# Patient Record
Sex: Female | Born: 1955
Health system: Southern US, Community
[De-identification: ages and names within clinical notes are randomized; demographics above are authoritative.]

## PROBLEM LIST (undated history)

## (undated) DIAGNOSIS — F320 Major depressive disorder, single episode, mild: Secondary | ICD-10-CM

## (undated) DIAGNOSIS — E782 Mixed hyperlipidemia: Secondary | ICD-10-CM

## (undated) DIAGNOSIS — K219 Gastro-esophageal reflux disease without esophagitis: Secondary | ICD-10-CM

## (undated) DIAGNOSIS — K8309 Other cholangitis: Secondary | ICD-10-CM

## (undated) DIAGNOSIS — R5383 Other fatigue: Secondary | ICD-10-CM

## (undated) DIAGNOSIS — M0689 Other specified rheumatoid arthritis, multiple sites: Secondary | ICD-10-CM

## (undated) DIAGNOSIS — L710 Perioral dermatitis: Secondary | ICD-10-CM

## (undated) DIAGNOSIS — E1169 Type 2 diabetes mellitus with other specified complication: Secondary | ICD-10-CM

## (undated) HISTORY — DX: Other fatigue: R53.83

## (undated) HISTORY — DX: Type 2 diabetes mellitus with other specified complication: E11.69

## (undated) HISTORY — DX: Gastro-esophageal reflux disease without esophagitis: K21.9

## (undated) HISTORY — PX: CHOLECYSTECTOMY: SHX55

## (undated) HISTORY — DX: Other cholangitis: K83.09

## (undated) HISTORY — DX: Major depressive disorder, single episode, mild: F32.0

## (undated) HISTORY — DX: Perioral dermatitis: L71.0

## (undated) HISTORY — PX: ERCP: SHX60

## (undated) HISTORY — DX: Other specified rheumatoid arthritis, multiple sites: M06.89

## (undated) HISTORY — DX: Mixed hyperlipidemia: E78.2

## (undated) NOTE — *Deleted (*Deleted)
Physician Discharge Summary  Patient ID: Alexandra [name] MRN: [id number] DOB/AGE: Nov 07, 1955 26 y.o.  Admit date: 06/27/2020 Discharge date: 06/29/2020  Admission Diagnoses: Cervical Myelopathy with herniated cervical disc at C 34 with cord compression and cord signal change. Severe spinal stenosis with AP diameter of spinal canal of 3.6 mm    Discharge Diagnoses: Cervical Myelopathy with herniated cervical disc at C 34 with cord compression and cord signal change. Severe spinal stenosis with AP diameter of spinal canal of 3.6 mm s/p Cervical three-four Anterior cervical decompression/discectomy/fusion (N/A) with 6 mm small lordotic  PEEK cage with autograft and anterior cervical plate     Active Problems:   Cervical myelopathy [facility])   Discharged Condition: good  Hospital Course: Alexandra [name] was admitted for surgery with dx cervical myelopathy with HNP and cord signal change. Following uncomplicated ACDF C3-4, she recovered well and transferred to [facility] for nursing care and therapies. She is progressing nicely.  Consults: None  Significant Diagnostic Studies: radiology: X-Ray: intra-op  Treatments: surgery: Cervical three-four Anterior cervical decompression/discectomy/fusion (N/A) with 6 mm small lordotic  PEEK cage with autograft and anterior cervical plate     Discharge Exam: Blood pressure (!) 144/74, pulse 98, temperature 98.1 F (36.7 C), temperature source Oral, resp. rate 18, height 5\' 5"  (1.651 m), weight 125.2 kg, SpO2 95 %. Alert, conversant sitting in chair. Denies pain at present. Good strength bilat deltoids, biceps and triceps, with improved but not full hand intrinsics. Ambulating with rolling walker, improved. Incision without erythema, swelling or drainage beneath honeycomb and Dermabond. Soft collar in use.   Disposition: Discharge to home with HHPT per DrStern. Office f/u in 3 weeks. Pt verbalizes understanding of d/c instructions. Norco 5/325 and Robaxin 500mg  will be  eRx'ed for prn home use.      Allergies as of 06/29/2020   No Known Allergies   Med Rec must be completed prior to using this [facility]***       Follow-up Information    [facility] Billing Dept. Call.   Why: For assistance with a payment plan on your hospital stay.  Contact information: [phone]       [facility] Dept. of Social Services. Call.   Why: To request info on financial assistance.  Contact information: [phone]       [facility], [facility] Follow up.   Specialty: Home Health Services Why: Home Health PT arranged. They will contact you about 48 hours after discharge to arrange the first visit.  Contact information: [address] [place] [address] [phone]               Signed: Georgiann Cocker 06/29/2020, 11:07 AM

---

## 2017-09-23 DIAGNOSIS — Z1231 Encounter for screening mammogram for malignant neoplasm of breast: Secondary | ICD-10-CM | POA: Diagnosis not present

## 2017-09-23 LAB — HM MAMMOGRAPHY

## 2017-09-27 DIAGNOSIS — I10 Essential (primary) hypertension: Secondary | ICD-10-CM | POA: Diagnosis not present

## 2017-09-27 DIAGNOSIS — Z01818 Encounter for other preprocedural examination: Secondary | ICD-10-CM | POA: Diagnosis not present

## 2017-09-27 DIAGNOSIS — E1169 Type 2 diabetes mellitus with other specified complication: Secondary | ICD-10-CM | POA: Diagnosis not present

## 2017-09-27 DIAGNOSIS — R740 Nonspecific elevation of levels of transaminase and lactic acid dehydrogenase [LDH]: Secondary | ICD-10-CM | POA: Diagnosis not present

## 2017-09-27 DIAGNOSIS — K8065 Calculus of gallbladder and bile duct with chronic cholecystitis with obstruction: Secondary | ICD-10-CM | POA: Diagnosis not present

## 2017-09-27 DIAGNOSIS — K760 Fatty (change of) liver, not elsewhere classified: Secondary | ICD-10-CM | POA: Diagnosis not present

## 2017-09-27 DIAGNOSIS — F419 Anxiety disorder, unspecified: Secondary | ICD-10-CM | POA: Diagnosis not present

## 2017-09-27 DIAGNOSIS — E119 Type 2 diabetes mellitus without complications: Secondary | ICD-10-CM | POA: Diagnosis not present

## 2017-09-27 DIAGNOSIS — K8042 Calculus of bile duct with acute cholecystitis without obstruction: Secondary | ICD-10-CM | POA: Diagnosis not present

## 2017-09-27 DIAGNOSIS — K851 Biliary acute pancreatitis without necrosis or infection: Secondary | ICD-10-CM | POA: Diagnosis not present

## 2017-09-27 DIAGNOSIS — K8050 Calculus of bile duct without cholangitis or cholecystitis without obstruction: Secondary | ICD-10-CM | POA: Diagnosis not present

## 2017-09-27 DIAGNOSIS — R748 Abnormal levels of other serum enzymes: Secondary | ICD-10-CM | POA: Diagnosis not present

## 2017-09-27 DIAGNOSIS — K8010 Calculus of gallbladder with chronic cholecystitis without obstruction: Secondary | ICD-10-CM | POA: Diagnosis not present

## 2017-09-27 DIAGNOSIS — R079 Chest pain, unspecified: Secondary | ICD-10-CM | POA: Diagnosis not present

## 2017-09-27 DIAGNOSIS — K740 Hepatic fibrosis: Secondary | ICD-10-CM | POA: Diagnosis not present

## 2017-09-27 DIAGNOSIS — K8020 Calculus of gallbladder without cholecystitis without obstruction: Secondary | ICD-10-CM | POA: Diagnosis not present

## 2017-09-27 DIAGNOSIS — R945 Abnormal results of liver function studies: Secondary | ICD-10-CM | POA: Diagnosis not present

## 2017-09-27 DIAGNOSIS — R1013 Epigastric pain: Secondary | ICD-10-CM | POA: Diagnosis not present

## 2017-09-27 DIAGNOSIS — K831 Obstruction of bile duct: Secondary | ICD-10-CM | POA: Diagnosis not present

## 2017-09-27 DIAGNOSIS — E1101 Type 2 diabetes mellitus with hyperosmolarity with coma: Secondary | ICD-10-CM | POA: Diagnosis not present

## 2017-09-28 DIAGNOSIS — Z01818 Encounter for other preprocedural examination: Secondary | ICD-10-CM

## 2017-09-30 DIAGNOSIS — K740 Hepatic fibrosis: Secondary | ICD-10-CM | POA: Diagnosis not present

## 2017-09-30 DIAGNOSIS — K8010 Calculus of gallbladder with chronic cholecystitis without obstruction: Secondary | ICD-10-CM | POA: Diagnosis not present

## 2017-10-14 DIAGNOSIS — K8309 Other cholangitis: Secondary | ICD-10-CM | POA: Diagnosis not present

## 2017-10-14 DIAGNOSIS — R799 Abnormal finding of blood chemistry, unspecified: Secondary | ICD-10-CM | POA: Diagnosis not present

## 2017-10-14 DIAGNOSIS — K8010 Calculus of gallbladder with chronic cholecystitis without obstruction: Secondary | ICD-10-CM | POA: Insufficient documentation

## 2017-10-14 DIAGNOSIS — Z09 Encounter for follow-up examination after completed treatment for conditions other than malignant neoplasm: Secondary | ICD-10-CM | POA: Insufficient documentation

## 2017-11-04 DIAGNOSIS — J09X2 Influenza due to identified novel influenza A virus with other respiratory manifestations: Secondary | ICD-10-CM | POA: Diagnosis not present

## 2017-11-04 DIAGNOSIS — J060 Acute laryngopharyngitis: Secondary | ICD-10-CM | POA: Diagnosis not present

## 2017-11-14 DIAGNOSIS — Z760 Encounter for issue of repeat prescription: Secondary | ICD-10-CM | POA: Diagnosis not present

## 2018-01-20 DIAGNOSIS — L988 Other specified disorders of the skin and subcutaneous tissue: Secondary | ICD-10-CM | POA: Diagnosis not present

## 2018-01-26 DIAGNOSIS — M25511 Pain in right shoulder: Secondary | ICD-10-CM | POA: Diagnosis not present

## 2018-02-23 DIAGNOSIS — M25512 Pain in left shoulder: Secondary | ICD-10-CM | POA: Diagnosis not present

## 2018-02-23 DIAGNOSIS — M7542 Impingement syndrome of left shoulder: Secondary | ICD-10-CM | POA: Diagnosis not present

## 2018-04-22 DIAGNOSIS — N3000 Acute cystitis without hematuria: Secondary | ICD-10-CM | POA: Diagnosis not present

## 2018-04-22 DIAGNOSIS — E782 Mixed hyperlipidemia: Secondary | ICD-10-CM | POA: Diagnosis not present

## 2018-04-22 DIAGNOSIS — E1169 Type 2 diabetes mellitus with other specified complication: Secondary | ICD-10-CM | POA: Diagnosis not present

## 2018-04-22 DIAGNOSIS — R5383 Other fatigue: Secondary | ICD-10-CM | POA: Diagnosis not present

## 2018-05-01 DIAGNOSIS — R768 Other specified abnormal immunological findings in serum: Secondary | ICD-10-CM | POA: Diagnosis not present

## 2018-05-01 DIAGNOSIS — M130 Polyarthritis, unspecified: Secondary | ICD-10-CM | POA: Diagnosis not present

## 2018-05-01 DIAGNOSIS — M7989 Other specified soft tissue disorders: Secondary | ICD-10-CM | POA: Diagnosis not present

## 2018-05-19 DIAGNOSIS — M0579 Rheumatoid arthritis with rheumatoid factor of multiple sites without organ or systems involvement: Secondary | ICD-10-CM | POA: Diagnosis not present

## 2018-05-19 DIAGNOSIS — R768 Other specified abnormal immunological findings in serum: Secondary | ICD-10-CM | POA: Diagnosis not present

## 2018-05-19 DIAGNOSIS — M130 Polyarthritis, unspecified: Secondary | ICD-10-CM | POA: Diagnosis not present

## 2018-06-22 DIAGNOSIS — M0579 Rheumatoid arthritis with rheumatoid factor of multiple sites without organ or systems involvement: Secondary | ICD-10-CM | POA: Diagnosis not present

## 2018-08-20 DIAGNOSIS — M130 Polyarthritis, unspecified: Secondary | ICD-10-CM | POA: Diagnosis not present

## 2018-08-20 DIAGNOSIS — R768 Other specified abnormal immunological findings in serum: Secondary | ICD-10-CM | POA: Diagnosis not present

## 2018-08-20 DIAGNOSIS — M0579 Rheumatoid arthritis with rheumatoid factor of multiple sites without organ or systems involvement: Secondary | ICD-10-CM | POA: Diagnosis not present

## 2018-09-17 DIAGNOSIS — E782 Mixed hyperlipidemia: Secondary | ICD-10-CM | POA: Diagnosis not present

## 2018-09-17 DIAGNOSIS — E1169 Type 2 diabetes mellitus with other specified complication: Secondary | ICD-10-CM | POA: Diagnosis not present

## 2018-09-17 DIAGNOSIS — Z23 Encounter for immunization: Secondary | ICD-10-CM | POA: Diagnosis not present

## 2020-02-01 ENCOUNTER — Other Ambulatory Visit: Payer: Self-pay

## 2020-02-01 ENCOUNTER — Encounter: Payer: Self-pay | Admitting: Physician Assistant

## 2020-02-01 ENCOUNTER — Ambulatory Visit (INDEPENDENT_AMBULATORY_CARE_PROVIDER_SITE_OTHER): Payer: 59 | Admitting: Physician Assistant

## 2020-02-01 VITALS — BP 142/62 | HR 73 | Temp 97.9°F | Ht 64.5 in | Wt 271.0 lb

## 2020-02-01 DIAGNOSIS — N3001 Acute cystitis with hematuria: Secondary | ICD-10-CM | POA: Diagnosis not present

## 2020-02-01 DIAGNOSIS — E119 Type 2 diabetes mellitus without complications: Secondary | ICD-10-CM | POA: Diagnosis not present

## 2020-02-01 DIAGNOSIS — E782 Mixed hyperlipidemia: Secondary | ICD-10-CM | POA: Insufficient documentation

## 2020-02-01 DIAGNOSIS — I10 Essential (primary) hypertension: Secondary | ICD-10-CM | POA: Diagnosis not present

## 2020-02-01 LAB — POCT URINALYSIS DIPSTICK
Bilirubin, UA: NEGATIVE
Glucose, UA: POSITIVE — AB
Ketones, UA: NEGATIVE
Nitrite, UA: NEGATIVE
Protein, UA: POSITIVE — AB
Spec Grav, UA: >=1.03 — AB (ref 1.010–1.025)
Urobilinogen, UA: NEGATIVE E.U./dL — AB
pH, UA: 5.5 (ref 5.0–8.0)

## 2020-02-01 LAB — POCT UA - MICROALBUMIN: Microalbumin Ur, POC: 150 mg/L

## 2020-02-01 MED ORDER — METFORMIN HCL 1000 MG PO TABS: 1000.0000 mg | ORAL_TABLET | Freq: Two times a day (BID) | ORAL | 0 refills | Status: DC

## 2020-02-01 MED ORDER — ROSUVASTATIN CALCIUM 5 MG PO TABS: 5.0000 mg | ORAL_TABLET | Freq: Every day | ORAL | 0 refills | Status: DC

## 2020-02-01 MED ORDER — MONTELUKAST SODIUM 10 MG PO TABS: 10.0000 mg | ORAL_TABLET | Freq: Every day | ORAL | 3 refills | Status: DC

## 2020-02-01 MED ORDER — RAMIPRIL 5 MG PO CAPS: 5.0000 mg | ORAL_CAPSULE | Freq: Every day | ORAL | 0 refills | Status: DC

## 2020-02-01 MED ORDER — FLUCONAZOLE 150 MG PO TABS: ORAL_TABLET | ORAL | 0 refills | Status: DC

## 2020-02-01 MED ORDER — RYBELSUS 3 MG PO TABS: 3.0000 mg | ORAL_TABLET | Freq: Every day | ORAL | 2 refills | Status: DC

## 2020-02-01 MED ORDER — NITROFURANTOIN MONOHYD MACRO 100 MG PO CAPS: 100.0000 mg | ORAL_CAPSULE | Freq: Two times a day (BID) | ORAL | 0 refills | Status: DC

## 2020-02-01 NOTE — Assessment & Plan Note (Signed)
Urine culture pending rx for macrobid 

## 2020-02-01 NOTE — Assessment & Plan Note (Signed)
labwork pending Follow up in one month Restart glucophage 1000mg  bid Start rybelsus 3mg  qd

## 2020-02-01 NOTE — Assessment & Plan Note (Signed)
Restart ramipril 5mg  qd labwork pending Follow up one month

## 2020-02-01 NOTE — Progress Notes (Signed)
Established Patient Office Visit  Subjective:  Patient ID: Alexandra [name], female    DOB: 09-26-1955  Age: 64 y.o. MRN: [id number]  CC:  Chief Complaint  Patient presents with  . Diabetes    HPI Alexandra [name] presents for follow up diabetes Pt states she did not have insurance and stopped all of her medications over 3 months ago - she has no idea what her sugars have been running but has been very tired and fatigued Last a1c was out of control - has not been in office in over 9 months  Pt with history of hyperlipidemia - no meds in over 3 months - not really watching diet  Pt with history of RA - did see specialist yesterday and restarted her methotrexate  Pt complains of urinary  Frequency and urgency - has been going on for 'awhile'  Past Medical History:  Diagnosis Date  . Gastro-esophageal reflux disease without esophagitis   . Major depressive disorder, single episode, mild ([place])   . Mixed hyperlipidemia   . Other cholangitis   . Other fatigue   . Other specified rheumatoid arthritis, multiple sites ([place])   . Perioral dermatitis   . Type 2 diabetes mellitus with other specified complication [facility])     Past Surgical History:  Procedure Laterality Date  . CHOLECYSTECTOMY    . ERCP      History reviewed. No pertinent family history.  Social History   Socioeconomic History  . Marital status: Single    Spouse name: Not on file  . Number of children: Not on file  . Years of education: Not on file  . Highest education level: Not on file  Occupational History  . Not on file  Tobacco Use  . Smoking status: Never Smoker  . Smokeless tobacco: Never Used  Vaping Use  . Vaping Use: Never used  Substance and Sexual Activity  . Alcohol use: Never  . Drug use: Never  . Sexual activity: Not on file  Other Topics Concern  . Not on file  Social History Narrative  . Not on file   Social Determinants of Health   Financial Resource Strain:   . Difficulty of Paying Living  Expenses:   Food Insecurity:   . Worried About Charity fundraiser in the Last Year:   . Arboriculturist in the Last Year:   Transportation Needs:   . Film/video editor (Medical):   Marland Kitchen Lack of Transportation (Non-Medical):   Physical Activity:   . Days of Exercise per Week:   . Minutes of Exercise per Session:   Stress:   . Feeling of Stress :   Social Connections:   . Frequency of Communication with Friends and Family:   . Frequency of Social Gatherings with Friends and Family:   . Attends Religious Services:   . Active Member of Clubs or Organizations:   . Attends Archivist Meetings:   Marland Kitchen Marital Status:   Intimate Partner Violence:   . Fear of Current or Ex-Partner:   . Emotionally Abused:   Marland Kitchen Physically Abused:   . Sexually Abused:      Current Outpatient Medications:  .  fluconazole (DIFLUCAN) 150 MG tablet, 1 po qod for 3 doses, Disp: 3 tablet, Rfl: 0 .  folic acid (FOLVITE) 1 MG tablet, Take 1 mg by mouth daily., Disp: , Rfl:  .  LORazepam (ATIVAN) 1 MG tablet, Take by mouth., Disp: , Rfl:  .  metFORMIN (GLUCOPHAGE) 1000  MG tablet, Take 1 tablet (1,000 mg total) by mouth 2 (two) times daily with a meal., Disp: 180 tablet, Rfl: 0 .  methotrexate (RHEUMATREX) 2.5 MG tablet, (10 weekly), Disp: , Rfl:  .  montelukast (SINGULAIR) 10 MG tablet, Take 1 tablet (10 mg total) by mouth daily., Disp: 90 tablet, Rfl: 3 .  nitrofurantoin, macrocrystal-monohydrate, (MACROBID) 100 MG capsule, Take 1 capsule (100 mg total) by mouth 2 (two) times daily., Disp: 20 capsule, Rfl: 0 .  ramipril (ALTACE) 5 MG capsule, Take 1 capsule (5 mg total) by mouth daily., Disp: 90 capsule, Rfl: 0 .  rosuvastatin (CRESTOR) 5 MG tablet, Take 1 tablet (5 mg total) by mouth daily., Disp: 90 tablet, Rfl: 0 .  Semaglutide (RYBELSUS) 3 MG TABS, Take 3 mg by mouth daily., Disp: 30 tablet, Rfl: 2   No Known Allergies  ROS CONSTITUTIONAL: see HPI E/N/T: Negative for ear pain, nasal congestion  and sore throat.  CARDIOVASCULAR: Negative for chest pain, dizziness, palpitations and pedal edema.  RESPIRATORY: Negative for recent cough and dyspnea.  GASTROINTESTINAL: Negative for abdominal pain, acid reflux symptoms, constipation, diarrhea, nausea and vomiting.  MSK: Negative for arthralgias and myalgias.  GU  - see HPI INTEGUMENTARY: Negative for rash.  NEUROLOGICAL: Negative for dizziness and headaches.  PSYCHIATRIC: Negative for sleep disturbance and to question depression screen.  Negative for depression, negative for anhedonia.        Objective:    PHYSICAL EXAM:   VS: BP (!) 142/62 (BP Location: Left Arm, Patient Position: Sitting)   Pulse 73   Temp 97.9 F (36.6 C) (Temporal)   Ht 5' 4.5" (1.638 m)   Wt 271 lb (122.9 kg)   BMI 45.80 kg/m   GEN: Well nourished, well developed, in no acute distress  Cardiac: RRR; no murmurs, rubs, or gallops,no edema - no significant varicosities Respiratory:  normal respiratory rate and pattern with no distress - normal breath sounds with no rales, rhonchi, wheezes or rubs MS: no deformity or atrophy  Skin: warm and dry, no rash  Neuro:  Alert and Oriented x 3, Strength and sensation are intact - CN II-Xii grossly intact Psych: euthymic mood, appropriate affect and demeanor  BP (!) 142/62 (BP Location: Left Arm, Patient Position: Sitting)   Pulse 73   Temp 97.9 F (36.6 C) (Temporal)   Ht 5' 4.5" (1.638 m)   Wt 271 lb (122.9 kg)   BMI 45.80 kg/m  Wt Readings from Last 3 Encounters:  02/01/20 271 lb (122.9 kg)     Health Maintenance Due  Topic Date Due  . HEMOGLOBIN A1C  Never done  . Hepatitis C Screening  Never done  . PNEUMOCOCCAL POLYSACCHARIDE VACCINE AGE 21-64 HIGH RISK  Never done  . FOOT EXAM  Never done  . OPHTHALMOLOGY EXAM  Never done  . COVID-19 Vaccine (1) Never done  . HIV Screening  Never done  . TETANUS/TDAP  Never done  . PAP SMEAR-Modifier  Never done  . MAMMOGRAM  Never done  . COLONOSCOPY  Never  done    There are no preventive care reminders to display for this patient.  No results found for: TSH No results found for: WBC, HGB, HCT, MCV, PLT No results found for: NA, K, CHLORIDE, CO2, GLUCOSE, BUN, CREATININE, BILITOT, ALKPHOS, AST, ALT, PROT, ALBUMIN, CALCIUM, ANIONGAP, EGFR, GFR No results found for: CHOL No results found for: HDL No results found for: LDLCALC No results found for: TRIG No results found for: CHOLHDL No results  found for: HGBA1C    Assessment & Plan:   Problem List Items Addressed This Visit      Cardiovascular and Mediastinum   Essential hypertension    Restart ramipril 21m qd labwork pending Follow up one month      Relevant Medications   ramipril (ALTACE) 5 MG capsule   rosuvastatin (CRESTOR) 5 MG tablet   Other Relevant Orders   CBC with Differential/Platelet   Comprehensive metabolic panel   TSH     Endocrine   Diabetes mellitus type II, non insulin dependent [place]    labwork pending Follow up in one month Restart glucophage 10012mbid Start rybelsus 58m62md       Relevant Medications   Semaglutide (RYBELSUS) 3 MG TABS   metFORMIN (GLUCOPHAGE) 1000 MG tablet   ramipril (ALTACE) 5 MG capsule   rosuvastatin (CRESTOR) 5 MG tablet   Other Relevant Orders   Hemoglobin A1c   POCT urinalysis dipstick (Completed)   POCT UA - Microalbumin (Completed)     Genitourinary   Acute cystitis with hematuria    Urine culture pending rx for macrobid      Relevant Medications   nitrofurantoin, macrocrystal-monohydrate, (MACROBID) 100 MG capsule   Other Relevant Orders   Urine Culture     Other   Mixed hyperlipidemia   Relevant Medications   ramipril (ALTACE) 5 MG capsule   rosuvastatin (CRESTOR) 5 MG tablet   Other Relevant Orders   Lipid panel      Meds ordered this encounter  Medications  . Semaglutide (RYBELSUS) 3 MG TABS    Sig: Take 3 mg by mouth daily.    Dispense:  30 tablet    Refill:  2    Order Specific  Question:   Supervising Provider    Answer:  Rochel [id number] nitrofurantoin, macrocrystal-monohydrate, (MACROBID) 100 MG capsule    Sig: Take 1 capsule (100 mg total) by mouth 2 (two) times daily.    Dispense:  20 capsule    Refill:  0    Order Specific Question:   Supervising Provider    Answer:  Rochel [id number] fluconazole (DIFLUCAN) 150 MG tablet    Sig: 1 po qod for 3 doses    Dispense:  3 tablet    Refill:  0    Order Specific Question:   Supervising Provider    Answer:  Shelton Silvas metFORMIN (GLUCOPHAGE) 1000 MG tablet    Sig: Take 1 tablet (1,000 mg total) by mouth 2 (two) times daily with a meal.    Dispense:  180 tablet    Refill:  0    Order Specific Question:   Supervising Provider    Answer:  Shelton Silvas ramipril (ALTACE) 5 MG capsule    Sig: Take 1 capsule (5 mg total) by mouth daily.    Dispense:  90 capsule    Refill:  0    Order Specific Question:   Supervising Provider    Answer:  Rochel [id number] montelukast (SINGULAIR) 10 MG tablet    Sig: Take 1 tablet (10 mg total) by mouth daily.    Dispense:  90 tablet    Refill:  3    Order Specific Question:   Supervising Provider    Answer:  Rochel [id number] rosuvastatin (CRESTOR) 5 MG tablet    Sig: Take 1 tablet (5 mg total) by  mouth daily.    Dispense:  90 tablet    Refill:  0    Order Specific Question:   Supervising Provider    Answer:   Shelton Silvas    Follow-up: Return in about 4 weeks (around 02/29/2020) for follow up.    SARA R Candita Borenstein, PA-C

## 2020-02-02 LAB — CBC WITH DIFFERENTIAL/PLATELET
Basophils Absolute: 0.1 10*3/uL (ref 0.0–0.2)
Basos: 1 %
EOS (ABSOLUTE): 0.2 10*3/uL (ref 0.0–0.4)
Eos: 3 %
Hematocrit: 46.6 % (ref 34.0–46.6)
Hemoglobin: 15.3 g/dL (ref 11.1–15.9)
Immature Grans (Abs): 0 10*3/uL (ref 0.0–0.1)
Immature Granulocytes: 0 %
Lymphocytes Absolute: 1.8 10*3/uL (ref 0.7–3.1)
Lymphs: 26 %
MCH: 30.8 pg (ref 26.6–33.0)
MCHC: 32.8 g/dL (ref 31.5–35.7)
MCV: 94 fL (ref 79–97)
Monocytes Absolute: 0.6 10*3/uL (ref 0.1–0.9)
Monocytes: 9 %
Neutrophils Absolute: 4.2 10*3/uL (ref 1.4–7.0)
Neutrophils: 61 %
Platelets: 268 10*3/uL (ref 150–450)
RBC: 4.97 x10E6/uL (ref 3.77–5.28)
RDW: 12.5 % (ref 11.7–15.4)
WBC: 6.9 10*3/uL (ref 3.4–10.8)

## 2020-02-02 LAB — COMPREHENSIVE METABOLIC PANEL
ALT: 32 IU/L (ref 0–32)
AST: 32 IU/L (ref 0–40)
Albumin/Globulin Ratio: 1.4 (ref 1.2–2.2)
Albumin: 4.1 g/dL (ref 3.8–4.8)
Alkaline Phosphatase: 86 IU/L (ref 48–121)
BUN/Creatinine Ratio: 22 (ref 12–28)
BUN: 22 mg/dL (ref 8–27)
Bilirubin Total: 0.5 mg/dL (ref 0.0–1.2)
CO2: 23 mmol/L (ref 20–29)
Calcium: 9.9 mg/dL (ref 8.7–10.3)
Chloride: 97 mmol/L (ref 96–106)
Creatinine, Ser: 1 mg/dL (ref 0.57–1.00)
GFR calc Af Amer: 70 mL/min/{1.73_m2} (ref >59)
GFR calc non Af Amer: 61 mL/min/{1.73_m2} (ref >59)
Globulin, Total: 2.9 g/dL (ref 1.5–4.5)
Glucose: 364 mg/dL — ABNORMAL HIGH (ref 65–99)
Potassium: 4.6 mmol/L (ref 3.5–5.2)
Sodium: 133 mmol/L — ABNORMAL LOW (ref 134–144)
Total Protein: 7 g/dL (ref 6.0–8.5)

## 2020-02-02 LAB — LIPID PANEL
Chol/HDL Ratio: 8 ratio — ABNORMAL HIGH (ref 0.0–4.4)
Cholesterol, Total: 249 mg/dL — ABNORMAL HIGH (ref 100–199)
HDL: 31 mg/dL — ABNORMAL LOW (ref >39)
LDL Chol Calc (NIH): 152 mg/dL — ABNORMAL HIGH (ref 0–99)
Triglycerides: 351 mg/dL — ABNORMAL HIGH (ref 0–149)
VLDL Cholesterol Cal: 66 mg/dL — ABNORMAL HIGH (ref 5–40)

## 2020-02-02 LAB — URINE CULTURE

## 2020-02-02 LAB — HEMOGLOBIN A1C
Est. average glucose Bld gHb Est-mCnc: 298 mg/dL
Hgb A1c MFr Bld: 12 % — ABNORMAL HIGH (ref 4.8–5.6)

## 2020-02-02 LAB — TSH: TSH: 3.04 u[IU]/mL (ref 0.450–4.500)

## 2020-02-02 LAB — CARDIOVASCULAR RISK ASSESSMENT

## 2020-02-03 ENCOUNTER — Other Ambulatory Visit: Payer: Self-pay

## 2020-02-03 MED ORDER — CITALOPRAM HYDROBROMIDE 40 MG PO TABS: 40.0000 mg | ORAL_TABLET | Freq: Every day | ORAL | 0 refills | Status: DC

## 2020-02-03 MED ORDER — CITALOPRAM HYDROBROMIDE 20 MG PO TABS: 20.0000 mg | ORAL_TABLET | Freq: Every day | ORAL | 0 refills | Status: DC

## 2020-02-03 MED ORDER — SOLIFENACIN SUCCINATE 5 MG PO TABS
5.0000 mg | ORAL_TABLET | Freq: Every day | ORAL | 2 refills | Status: DC
Start: 2020-02-03 — End: 2020-02-29

## 2020-02-03 NOTE — Telephone Encounter (Signed)
Incorrect dosage sent in for patient's Celexa, it is supposed to be 20mg  instead of 40mg . Called [facility] and cancelled Rx for celexa 40mg , now sending celexa 20mg . [place]

## 2020-02-09 ENCOUNTER — Other Ambulatory Visit: Payer: Self-pay

## 2020-02-28 ENCOUNTER — Ambulatory Visit (INDEPENDENT_AMBULATORY_CARE_PROVIDER_SITE_OTHER): Payer: 59 | Admitting: Physician Assistant

## 2020-02-28 ENCOUNTER — Encounter: Payer: Self-pay | Admitting: Physician Assistant

## 2020-02-28 ENCOUNTER — Other Ambulatory Visit: Payer: Self-pay

## 2020-02-28 VITALS — BP 128/62 | HR 74 | Temp 98.2°F | Ht 64.5 in | Wt 274.0 lb

## 2020-02-28 DIAGNOSIS — E119 Type 2 diabetes mellitus without complications: Secondary | ICD-10-CM | POA: Diagnosis not present

## 2020-02-28 DIAGNOSIS — I10 Essential (primary) hypertension: Secondary | ICD-10-CM

## 2020-02-28 NOTE — Assessment & Plan Note (Signed)
Continue current meds cmp pending Call to notify us which meter to call in Follow up in 2 months

## 2020-02-28 NOTE — Progress Notes (Signed)
Acute Office Visit  Subjective:    Patient ID: Alexandra [name], female    DOB: 08-24-1955, 64 y.o.   MRN: [id number]  Chief Complaint  Patient presents with  . Diabetes    4 week follow up    HPI Patient is in today for follow up hypertension Pt restarted altace and tolerating medication well - bp stable today - denies chest pain/sob/edema  Pt to follow up for diabetes after restarting her medications and new rybelsus - states that she is tolerating meds well and voices no problems - says today she has not checked glucose at all because her meter not working - will call us back to let us know which monitor to call in covered by insurance  Past Medical History:  Diagnosis Date  . Gastro-esophageal reflux disease without esophagitis   . Major depressive disorder, single episode, mild (H[facility])   . Mixed hyperlipidemia   . Other cholangitis   . Other fatigue   . Other specified rheumatoid arthritis, multiple sites (H[facility])   . Perioral dermatitis   . Type 2 diabetes mellitus with other specified complication [facility])     Past Surgical History:  Procedure Laterality Date  . CHOLECYSTECTOMY    . ERCP      History reviewed. No pertinent family history.  Social History   Socioeconomic History  . Marital status: Single    Spouse name: Not on file  . Number of children: Not on file  . Years of education: Not on file  . Highest education level: Not on file  Occupational History  . Not on file  Tobacco Use  . Smoking status: Never Smoker  . Smokeless tobacco: Never Used  Vaping Use  . Vaping Use: Never used  Substance and Sexual Activity  . Alcohol use: Never  . Drug use: Never  . Sexual activity: Not on file  Other Topics Concern  . Not on file  Social History Narrative  . Not on file   Social Determinants of Health   Financial Resource Strain:   . Difficulty of Paying Living Expenses:   Food Insecurity:   . Worried About Programme researcher, broadcasting/film/video in the Last Year:   . Garment/textile technologist in the Last Year:   Transportation Needs:   . Freight forwarder (Medical):   Marland Kitchen Lack of Transportation (Non-Medical):   Physical Activity:   . Days of Exercise per Week:   . Minutes of Exercise per Session:   Stress:   . Feeling of Stress :   Social Connections:   . Frequency of Communication with Friends and Family:   . Frequency of Social Gatherings with Friends and Family:   . Attends Religious Services:   . Active Member of Clubs or Organizations:   . Attends Banker Meetings:   Marland Kitchen Marital Status:   Intimate Partner Violence:   . Fear of Current or Ex-Partner:   . Emotionally Abused:   Marland Kitchen Physically Abused:   . Sexually Abused:      Current Outpatient Medications:  .  citalopram (CELEXA) 20 MG tablet, Take 1 tablet (20 mg total) by mouth daily., Disp: 90 tablet, Rfl: 0 .  folic acid (FOLVITE) 1 MG tablet, Take 1 mg by mouth daily., Disp: , Rfl:  .  LORazepam (ATIVAN) 1 MG tablet, Take by mouth., Disp: , Rfl:  .  metFORMIN (GLUCOPHAGE) 1000 MG tablet, Take 1 tablet (1,000 mg total) by mouth 2 (two) times daily with a meal.,  Disp: 180 tablet, Rfl: 0 .  methotrexate (RHEUMATREX) 2.5 MG tablet, (10 weekly), Disp: , Rfl:  .  montelukast (SINGULAIR) 10 MG tablet, Take 1 tablet (10 mg total) by mouth daily., Disp: 90 tablet, Rfl: 3 .  ramipril (ALTACE) 5 MG capsule, Take 1 capsule (5 mg total) by mouth daily., Disp: 90 capsule, Rfl: 0 .  rosuvastatin (CRESTOR) 5 MG tablet, Take 1 tablet (5 mg total) by mouth daily., Disp: 90 tablet, Rfl: 0 .  Semaglutide (RYBELSUS) 3 MG TABS, Take 3 mg by mouth daily., Disp: 30 tablet, Rfl: 2 .  solifenacin (VESICARE) 5 MG tablet, Take 1 tablet (5 mg total) by mouth daily., Disp: 30 tablet, Rfl: 2   No Known Allergies  CONSTITUTIONAL: Negative for chills, fatigue, fever, unintentional weight gain and unintentional weight loss.  CARDIOVASCULAR: Negative for chest pain, dizziness, palpitations and pedal edema.  RESPIRATORY:  Negative for recent cough and dyspnea.  PSYCHIATRIC: Negative for sleep disturbance and to question depression screen.  Negative for depression, negative for anhedonia.         Objective:    PHYSICAL EXAM:   VS: BP 128/62 (BP Location: Left Arm, Patient Position: Sitting)   Pulse 74   Temp 98.2 F (36.8 C) (Temporal)   Ht 5' 4.5" (1.638 m)   Wt 274 lb (124.3 kg)   SpO2 98%   BMI 46.31 kg/m   GEN: Well nourished, well developed, in no acute distress   Cardiac: RRR; no murmurs, rubs, or gallops,no edema - no significant varicosities Respiratory:  normal respiratory rate and pattern with no distress - normal breath sounds with no rales, rhonchi, wheezes or rubs  Psych: euthymic mood, appropriate affect and demeanor   Wt Readings from Last 3 Encounters:  02/28/20 274 lb (124.3 kg)  02/01/20 271 lb (122.9 kg)    Health Maintenance Due  Topic Date Due  . Hepatitis C Screening  Never done  . PNEUMOCOCCAL POLYSACCHARIDE VACCINE AGE 50-64 HIGH RISK  Never done  . FOOT EXAM  Never done  . OPHTHALMOLOGY EXAM  Never done  . COVID-19 Vaccine (1) Never done  . HIV Screening  Never done  . TETANUS/TDAP  Never done  . PAP SMEAR-Modifier  Never done  . MAMMOGRAM  Never done  . COLONOSCOPY  Never done    There are no preventive care reminders to display for this patient.        Assessment & Plan:   Problem List Items Addressed This Visit      Cardiovascular and Mediastinum   Essential hypertension - Primary   Relevant Orders   Comprehensive metabolic panel     Endocrine   Diabetes mellitus type II, non insulin dependent (HCC)       No orders of the defined types were placed in this encounter.    SARA R DAVIS, PA-C

## 2020-02-28 NOTE — Assessment & Plan Note (Signed)
Well controlled.  ?No changes to medicines.  ?Continue to work on eating a healthy diet and exercise.  ?Labs drawn today.  ?

## 2020-02-29 ENCOUNTER — Other Ambulatory Visit: Payer: Self-pay | Admitting: Physician Assistant

## 2020-02-29 DIAGNOSIS — E119 Type 2 diabetes mellitus without complications: Secondary | ICD-10-CM

## 2020-02-29 LAB — COMPREHENSIVE METABOLIC PANEL
ALT: 21 IU/L (ref 0–32)
AST: 21 IU/L (ref 0–40)
Albumin/Globulin Ratio: 1.6 (ref 1.2–2.2)
Albumin: 4.2 g/dL (ref 3.8–4.8)
Alkaline Phosphatase: 67 IU/L (ref 48–121)
BUN/Creatinine Ratio: 20 (ref 12–28)
BUN: 18 mg/dL (ref 8–27)
Bilirubin Total: 0.4 mg/dL (ref 0.0–1.2)
CO2: 27 mmol/L (ref 20–29)
Calcium: 9.5 mg/dL (ref 8.7–10.3)
Chloride: 101 mmol/L (ref 96–106)
Creatinine, Ser: 0.9 mg/dL (ref 0.57–1.00)
GFR calc Af Amer: 80 mL/min/{1.73_m2} (ref >59)
GFR calc non Af Amer: 69 mL/min/{1.73_m2} (ref >59)
Globulin, Total: 2.6 g/dL (ref 1.5–4.5)
Glucose: 166 mg/dL — ABNORMAL HIGH (ref 65–99)
Potassium: 4.7 mmol/L (ref 3.5–5.2)
Sodium: 139 mmol/L (ref 134–144)
Total Protein: 6.8 g/dL (ref 6.0–8.5)

## 2020-02-29 MED ORDER — RYBELSUS 3 MG PO TABS: 3.0000 mg | ORAL_TABLET | Freq: Every day | ORAL | 1 refills | Status: DC

## 2020-04-17 ENCOUNTER — Other Ambulatory Visit: Payer: Self-pay | Admitting: Physician Assistant

## 2020-04-18 ENCOUNTER — Ambulatory Visit (INDEPENDENT_AMBULATORY_CARE_PROVIDER_SITE_OTHER): Payer: 59 | Admitting: Physician Assistant

## 2020-04-18 ENCOUNTER — Encounter: Payer: Self-pay | Admitting: Physician Assistant

## 2020-04-18 ENCOUNTER — Other Ambulatory Visit: Payer: Self-pay

## 2020-04-18 VITALS — BP 120/78 | HR 94 | Temp 97.1°F | Ht 64.0 in | Wt 255.0 lb

## 2020-04-18 DIAGNOSIS — R531 Weakness: Secondary | ICD-10-CM

## 2020-04-18 DIAGNOSIS — R202 Paresthesia of skin: Secondary | ICD-10-CM | POA: Diagnosis not present

## 2020-04-18 DIAGNOSIS — M545 Low back pain, unspecified: Secondary | ICD-10-CM

## 2020-04-18 MED ORDER — HYDROCODONE-ACETAMINOPHEN 10-325 MG PO TABS: 1.0000 | ORAL_TABLET | Freq: Three times a day (TID) | ORAL | 0 refills | Status: AC | PRN

## 2020-04-18 NOTE — Assessment & Plan Note (Signed)
rx for pain medication Follow up if pain worsens

## 2020-04-18 NOTE — Assessment & Plan Note (Signed)
labwork pending Referral to neurologist

## 2020-04-18 NOTE — Assessment & Plan Note (Signed)
labwork pending 

## 2020-04-18 NOTE — Progress Notes (Signed)
Acute Office Visit  Subjective:    Patient ID: Alexandra [name], female    DOB: [birth year], 64 y.o.   MRN: [id number]  Chief Complaint  Patient presents with  . Back Pain    Larey Seat last thursday in shower, has been taking tylenol  . weakness    Rt leg x 11/2019  . Numbness    BL arms x 2 months constant    HPI Patient is in today for low back pain - pt states she slipped in the shower and hit her lower back/rib area on the faucet 5 days ago - she is still bruised and very tender and sore - denies shortness of breath  Pt states that over the past several months she feels like she is dropping things and not grasping well - has had some tingling in hands as well - denies any pain  Pt also complains for the past 5 months she has had some weakness in her right leg - she feels at times her leg is going to 'give way' and she is losing her gait and balance  Past Medical History:  Diagnosis Date  . Gastro-esophageal reflux disease without esophagitis   . Major depressive disorder, single episode, mild (HCC)   . Mixed hyperlipidemia   . Other cholangitis   . Other fatigue   . Other specified rheumatoid arthritis, multiple sites (HCC)   . Perioral dermatitis   . Type 2 diabetes mellitus with other specified complication [facility])     Past Surgical History:  Procedure Laterality Date  . CHOLECYSTECTOMY    . ERCP      History reviewed. No pertinent family history.  Social History   Socioeconomic History  . Marital status: Single    Spouse name: Not on file  . Number of children: Not on file  . Years of education: Not on file  . Highest education level: Not on file  Occupational History  . Not on file  Tobacco Use  . Smoking status: Never Smoker  . Smokeless tobacco: Never Used  Vaping Use  . Vaping Use: Never used  Substance and Sexual Activity  . Alcohol use: Never  . Drug use: Never  . Sexual activity: Not on file  Other Topics Concern  . Not on file  Social History  Narrative  . Not on file   Social Determinants of Health   Financial Resource Strain:   . Difficulty of Paying Living Expenses: Not on file  Food Insecurity:   . Worried About Programme researcher, broadcasting/film/video in the Last Year: Not on file  . Ran Out of Food in the Last Year: Not on file  Transportation Needs:   . Lack of Transportation (Medical): Not on file  . Lack of Transportation (Non-Medical): Not on file  Physical Activity:   . Days of Exercise per Week: Not on file  . Minutes of Exercise per Session: Not on file  Stress:   . Feeling of Stress : Not on file  Social Connections:   . Frequency of Communication with Friends and Family: Not on file  . Frequency of Social Gatherings with Friends and Family: Not on file  . Attends Religious Services: Not on file  . Active Member of Clubs or Organizations: Not on file  . Attends Banker Meetings: Not on file  . Marital Status: Not on file  Intimate Partner Violence:   . Fear of Current or Ex-Partner: Not on file  . Emotionally Abused: Not on file  .  Physically Abused: Not on file  . Sexually Abused: Not on file     Current Outpatient Medications:  .  citalopram (CELEXA) 20 MG tablet, Take 1 tablet (20 mg total) by mouth daily., Disp: 90 tablet, Rfl: 0 .  folic acid (FOLVITE) 1 MG tablet, Take 1 mg by mouth daily., Disp: , Rfl:  .  LORazepam (ATIVAN) 1 MG tablet, Take by mouth., Disp: , Rfl:  .  metFORMIN (GLUCOPHAGE) 1000 MG tablet, Take 1 tablet (1,000 mg total) by mouth 2 (two) times daily with a meal., Disp: 180 tablet, Rfl: 0 .  methotrexate (RHEUMATREX) 2.5 MG tablet, (10 weekly), Disp: , Rfl:  .  montelukast (SINGULAIR) 10 MG tablet, TAKE 1 TABLET BY MOUTH DAILY, Disp: 90 tablet, Rfl: 3 .  ramipril (ALTACE) 5 MG capsule, Take 1 capsule (5 mg total) by mouth daily., Disp: 90 capsule, Rfl: 0 .  rosuvastatin (CRESTOR) 5 MG tablet, Take 1 tablet (5 mg total) by mouth daily., Disp: 90 tablet, Rfl: 0 .  Semaglutide (RYBELSUS)  3 MG TABS, Take 3 mg by mouth daily., Disp: 90 tablet, Rfl: 1   No Known Allergies  CONSTITUTIONAL: Negative for chills, fatigue, fever, unintentional weight gain and unintentional weight loss.   CARDIOVASCULAR: Negative for chest pain, dizziness, palpitations and pedal edema.  RESPIRATORY: Negative for recent cough and dyspnea.  MSK: see HPI INTEGUMENTARY: Negative for rash.  NEUROLOGICAL: see HPI PSYCHIATRIC: Negative for sleep disturbance and to question depression screen.  Negative for depression, negative for anhedonia.         Objective:    PHYSICAL EXAM:   VS: BP 120/78   Pulse 94   Temp (!) 97.1 F (36.2 C)   Ht 5\' 4"  (1.626 m)   Wt 255 lb (115.7 kg)   SpO2 98%   BMI 43.77 kg/m   GEN: Well nourished, well developed, in no acute distress  Cardiac: RRR; no murmurs, rubs, or gallops,no edema -  Respiratory:  normal respiratory rate and pattern with no distress - normal breath sounds with no rales, rhonchi, wheezes or rubs MS: no deformity or atrophy - has normal strength of upper arms - slightly decreased grasp - hard to assess gait because she is moving slow/limping and using cane because of pain she is having in lower back and side Skin: bruising noted on left lower back/flank area Psych: euthymic mood, appropriate affect and demeanor   Wt Readings from Last 3 Encounters:  04/18/20 255 lb (115.7 kg)  02/28/20 274 lb (124.3 kg)  02/01/20 271 lb (122.9 kg)    Health Maintenance Due  Topic Date Due  . Hepatitis C Screening  Never done  . PNEUMOCOCCAL POLYSACCHARIDE VACCINE AGE 7-64 HIGH RISK  Never done  . FOOT EXAM  Never done  . OPHTHALMOLOGY EXAM  Never done  . COVID-19 Vaccine (1) Never done  . HIV Screening  Never done  . TETANUS/TDAP  Never done  . PAP SMEAR-Modifier  Never done  . MAMMOGRAM  Never done  . COLONOSCOPY  Never done  . INFLUENZA VACCINE  03/19/2020    There are no preventive care reminders to display for this patient.         Assessment & Plan:   Problem List Items Addressed This Visit      Other   Acute left-sided low back pain without sciatica - Primary   Generalized weakness   Relevant Orders   CBC with Differential/Platelet   Comprehensive metabolic panel   TSH  B12 and Folate Panel   Ambulatory referral to Neurology   Paresthesia of both hands   Relevant Orders   CBC with Differential/Platelet   Comprehensive metabolic panel   TSH   B12 and Folate Panel   Ambulatory referral to Neurology       No orders of the defined types were placed in this encounter.    SARA R Myrick Mcnairy, PA-C

## 2020-04-19 LAB — CBC WITH DIFFERENTIAL/PLATELET
Basophils Absolute: 0.1 10*3/uL (ref 0.0–0.2)
Basos: 1 %
EOS (ABSOLUTE): 0.2 10*3/uL (ref 0.0–0.4)
Eos: 2 %
Hematocrit: 39.2 % (ref 34.0–46.6)
Hemoglobin: 13.3 g/dL (ref 11.1–15.9)
Immature Grans (Abs): 0 10*3/uL (ref 0.0–0.1)
Immature Granulocytes: 0 %
Lymphocytes Absolute: 2 10*3/uL (ref 0.7–3.1)
Lymphs: 20 %
MCH: 31.7 pg (ref 26.6–33.0)
MCHC: 33.9 g/dL (ref 31.5–35.7)
MCV: 93 fL (ref 79–97)
Monocytes Absolute: 0.7 10*3/uL (ref 0.1–0.9)
Monocytes: 7 %
Neutrophils Absolute: 6.8 10*3/uL (ref 1.4–7.0)
Neutrophils: 70 %
Platelets: 325 10*3/uL (ref 150–450)
RBC: 4.2 x10E6/uL (ref 3.77–5.28)
RDW: 13.9 % (ref 11.7–15.4)
WBC: 9.8 10*3/uL (ref 3.4–10.8)

## 2020-04-19 LAB — COMPREHENSIVE METABOLIC PANEL
ALT: 28 IU/L (ref 0–32)
AST: 27 IU/L (ref 0–40)
Albumin/Globulin Ratio: 1.3 (ref 1.2–2.2)
Albumin: 4 g/dL (ref 3.8–4.8)
Alkaline Phosphatase: 72 IU/L (ref 48–121)
BUN/Creatinine Ratio: 23 (ref 12–28)
BUN: 21 mg/dL (ref 8–27)
Bilirubin Total: 0.4 mg/dL (ref 0.0–1.2)
CO2: 25 mmol/L (ref 20–29)
Calcium: 9.7 mg/dL (ref 8.7–10.3)
Chloride: 97 mmol/L (ref 96–106)
Creatinine, Ser: 0.91 mg/dL (ref 0.57–1.00)
GFR calc Af Amer: 79 mL/min/{1.73_m2} (ref >59)
GFR calc non Af Amer: 68 mL/min/{1.73_m2} (ref >59)
Globulin, Total: 3 g/dL (ref 1.5–4.5)
Glucose: 242 mg/dL — ABNORMAL HIGH (ref 65–99)
Potassium: 4.4 mmol/L (ref 3.5–5.2)
Sodium: 136 mmol/L (ref 134–144)
Total Protein: 7 g/dL (ref 6.0–8.5)

## 2020-04-19 LAB — TSH: TSH: 2.53 u[IU]/mL (ref 0.450–4.500)

## 2020-04-19 LAB — B12 AND FOLATE PANEL
Folate: >20 ng/mL (ref >3.0)
Vitamin B-12: 267 pg/mL (ref 232–1245)

## 2020-05-01 ENCOUNTER — Other Ambulatory Visit: Payer: Self-pay | Admitting: Physician Assistant

## 2020-05-01 DIAGNOSIS — R531 Weakness: Secondary | ICD-10-CM

## 2020-05-01 DIAGNOSIS — M545 Low back pain, unspecified: Secondary | ICD-10-CM

## 2020-05-01 DIAGNOSIS — R202 Paresthesia of skin: Secondary | ICD-10-CM

## 2020-05-02 ENCOUNTER — Ambulatory Visit: Payer: 59 | Admitting: Physician Assistant

## 2020-05-05 ENCOUNTER — Telehealth: Payer: Self-pay

## 2020-05-05 NOTE — Telephone Encounter (Signed)
Pt called wanted to know if her MRI had been schedule, she had not heard anything from anyone and it has been a couple of weeks.

## 2020-05-05 NOTE — Telephone Encounter (Signed)
Notify it has been sent to referral nurse to schedule and get authorization from insurance

## 2020-05-08 ENCOUNTER — Telehealth: Payer: Self-pay

## 2020-05-08 NOTE — Telephone Encounter (Signed)
Called pt back, I was able to get her MRI schedule for Wednesday, 05/10/20 @ 11 AM. Pt didn't answer phone and I was not able to leave message. Beatris Si

## 2020-05-11 ENCOUNTER — Telehealth: Payer: Self-pay

## 2020-05-11 NOTE — Telephone Encounter (Signed)
Left a VM for patient to return call.  She missed her MRI appointment on 05/10/20, she will need to call and reschedule.

## 2020-05-22 ENCOUNTER — Other Ambulatory Visit: Payer: Self-pay | Admitting: Physician Assistant

## 2020-05-22 DIAGNOSIS — E782 Mixed hyperlipidemia: Secondary | ICD-10-CM

## 2020-05-22 DIAGNOSIS — E119 Type 2 diabetes mellitus without complications: Secondary | ICD-10-CM

## 2020-05-23 ENCOUNTER — Encounter: Payer: Self-pay | Admitting: Physician Assistant

## 2020-05-23 ENCOUNTER — Ambulatory Visit (INDEPENDENT_AMBULATORY_CARE_PROVIDER_SITE_OTHER): Payer: 59 | Admitting: Physician Assistant

## 2020-05-23 ENCOUNTER — Other Ambulatory Visit: Payer: Self-pay

## 2020-05-23 VITALS — BP 124/72 | HR 94 | Temp 97.1°F | Ht 61.0 in | Wt 271.0 lb

## 2020-05-23 DIAGNOSIS — R531 Weakness: Secondary | ICD-10-CM | POA: Diagnosis not present

## 2020-05-23 DIAGNOSIS — I10 Essential (primary) hypertension: Secondary | ICD-10-CM

## 2020-05-23 DIAGNOSIS — Z0000 Encounter for general adult medical examination without abnormal findings: Secondary | ICD-10-CM | POA: Insufficient documentation

## 2020-05-23 DIAGNOSIS — Z23 Encounter for immunization: Secondary | ICD-10-CM | POA: Insufficient documentation

## 2020-05-23 DIAGNOSIS — E119 Type 2 diabetes mellitus without complications: Secondary | ICD-10-CM

## 2020-05-23 MED ORDER — LORAZEPAM 1 MG PO TABS: 1.0000 mg | ORAL_TABLET | Freq: Every day | ORAL | 0 refills | Status: DC | PRN

## 2020-05-23 NOTE — Progress Notes (Signed)
Acute Office Visit  Subjective:    Patient ID: Alexandra [name], female    DOB: [birth year], 64 y.o.   MRN: [id number]  Chief Complaint  Patient presents with  . Hyperlipidemia  . Diabetes  . Anxiety    HPI Patient is in today for follow up hypertension Pt restarted altace and tolerating medication well - bp stable today - denies chest pain/sob/edema  Pt to follow up for diabetes after restarting her medications and new rybelsus - states that she is tolerating meds well and voices no problems -pt still is not checking her glucose  Pt still complains of generalized weakness and losing balance easily - states she is losing her grip and strength in upper extremities and also having some instability with lower extremities - she is scheduled to see neurology on 11/9 - she was scheduled for brain MRI on 9/22 but pt missed appt - would like to reschedule and to have done on open MRI  Past Medical History:  Diagnosis Date  . Gastro-esophageal reflux disease without esophagitis   . Major depressive disorder, single episode, mild (HCC)   . Mixed hyperlipidemia   . Other cholangitis   . Other fatigue   . Other specified rheumatoid arthritis, multiple sites (HCC)   . Perioral dermatitis   . Type 2 diabetes mellitus with other specified complication [facility])     Past Surgical History:  Procedure Laterality Date  . CHOLECYSTECTOMY    . ERCP      History reviewed. No pertinent family history.  Social History   Socioeconomic History  . Marital status: Single    Spouse name: Not on file  . Number of children: Not on file  . Years of education: Not on file  . Highest education level: Not on file  Occupational History  . Not on file  Tobacco Use  . Smoking status: Never Smoker  . Smokeless tobacco: Never Used  Vaping Use  . Vaping Use: Never used  Substance and Sexual Activity  . Alcohol use: Never  . Drug use: Never  . Sexual activity: Not on file  Other Topics Concern  . Not on  file  Social History Narrative  . Not on file   Social Determinants of Health   Financial Resource Strain:   . Difficulty of Paying Living Expenses: Not on file  Food Insecurity:   . Worried About Programme researcher, broadcasting/film/video in the Last Year: Not on file  . Ran Out of Food in the Last Year: Not on file  Transportation Needs:   . Lack of Transportation (Medical): Not on file  . Lack of Transportation (Non-Medical): Not on file  Physical Activity:   . Days of Exercise per Week: Not on file  . Minutes of Exercise per Session: Not on file  Stress:   . Feeling of Stress : Not on file  Social Connections:   . Frequency of Communication with Friends and Family: Not on file  . Frequency of Social Gatherings with Friends and Family: Not on file  . Attends Religious Services: Not on file  . Active Member of Clubs or Organizations: Not on file  . Attends Banker Meetings: Not on file  . Marital Status: Not on file  Intimate Partner Violence:   . Fear of Current or Ex-Partner: Not on file  . Emotionally Abused: Not on file  . Physically Abused: Not on file  . Sexually Abused: Not on file     Current Outpatient Medications:  .  citalopram (CELEXA) 20 MG tablet, TAKE 1 TABLET BY MOUTH DAILY, Disp: 90 tablet, Rfl: 0 .  folic acid (FOLVITE) 1 MG tablet, Take 1 mg by mouth daily., Disp: , Rfl:  .  LORazepam (ATIVAN) 1 MG tablet, Take 1 tablet (1 mg total) by mouth daily as needed for anxiety., Disp: 30 tablet, Rfl: 0 .  metFORMIN (GLUCOPHAGE) 1000 MG tablet, Take 1 tablet (1,000 mg total) by mouth 2 (two) times daily with a meal., Disp: 180 tablet, Rfl: 0 .  methotrexate (RHEUMATREX) 2.5 MG tablet, (10 weekly), Disp: , Rfl:  .  montelukast (SINGULAIR) 10 MG tablet, TAKE 1 TABLET BY MOUTH DAILY, Disp: 90 tablet, Rfl: 3 .  ramipril (ALTACE) 5 MG capsule, TAKE 1 CAPSULE BY MOUTH DAILY, Disp: 90 capsule, Rfl: 0 .  rosuvastatin (CRESTOR) 5 MG tablet, TAKE 1 TABLET BY MOUTH DAILY, Disp: 90  tablet, Rfl: 0 .  Semaglutide (RYBELSUS) 3 MG TABS, Take 3 mg by mouth daily., Disp: 90 tablet, Rfl: 1   No Known Allergies  CONSTITUTIONAL: Negative for chills, fatigue, fever, unintentional weight gain and unintentional weight loss.  CARDIOVASCULAR: Negative for chest pain, dizziness, palpitations and pedal edema.  RESPIRATORY: Negative for recent cough and dyspnea.  Neuro - see HPI PSYCHIATRIC: Negative for sleep disturbance and to question depression screen.  Negative for depression, negative for anhedonia.         Objective:    PHYSICAL EXAM:   VS: BP 124/72   Pulse 94   Temp (!) 97.1 F (36.2 C)   Ht 5\' 1"  (1.549 m)   Wt 271 lb (122.9 kg)   SpO2 99%   BMI 51.21 kg/m   GEN: Well nourished, well developed, in no acute distress   Cardiac: RRR; no murmurs, rubs, or gallops,no edema - no significant varicosities Respiratory:  normal respiratory rate and pattern with no distress - normal breath sounds with no rales, rhonchi, wheezes or rubs musc - walks with cane - decreased strength in upper extremities Psych: euthymic mood, appropriate affect and demeanor   Wt Readings from Last 3 Encounters:  05/23/20 271 lb (122.9 kg)  04/18/20 255 lb (115.7 kg)  02/28/20 274 lb (124.3 kg)    Health Maintenance Due  Topic Date Due  . Hepatitis C Screening  Never done  . PNEUMOCOCCAL POLYSACCHARIDE VACCINE AGE 89-64 HIGH RISK  Never done  . FOOT EXAM  Never done  . OPHTHALMOLOGY EXAM  Never done  . COVID-19 Vaccine (1) Never done  . HIV Screening  Never done  . PAP SMEAR-Modifier  Never done  . MAMMOGRAM  Never done  . COLONOSCOPY  Never done    There are no preventive care reminders to display for this patient.        Assessment & Plan:   Problem List Items Addressed This Visit      Cardiovascular and Mediastinum   Essential hypertension   Relevant Orders   CBC with Differential/Platelet   Comprehensive metabolic panel   TSH     Endocrine   Diabetes mellitus  type II, non insulin dependent (HCC)    Continue meds as directed and recommend better care for self Start checking glucose      Relevant Orders   Lipid panel   Hemoglobin A1c     Other   Generalized weakness    labwork pending Reschedule MRI Follow up with neurology as scheduled      Relevant Orders   CBC with Differential/Platelet   Comprehensive metabolic panel  TSH   Encounter for immunization - Primary    flucelvax given      Relevant Orders   Flu Vaccine MDCK QUAD PF (Completed)       Meds ordered this encounter  Medications  . LORazepam (ATIVAN) 1 MG tablet    Sig: Take 1 tablet (1 mg total) by mouth daily as needed for anxiety.    Dispense:  30 tablet    Refill:  0    Order Specific Question:   Supervising Provider    Answer:   COX, Fritzi Mandes [[id number]]     SARA R Nioka Thorington, PA-C

## 2020-05-23 NOTE — Assessment & Plan Note (Signed)
Continue meds as directed and recommend better care for self Start checking glucose

## 2020-05-23 NOTE — Assessment & Plan Note (Signed)
labwork pending Reschedule MRI Follow up with neurology as scheduled

## 2020-05-23 NOTE — Assessment & Plan Note (Signed)
flucelvax given 

## 2020-05-24 ENCOUNTER — Other Ambulatory Visit: Payer: Self-pay | Admitting: Physician Assistant

## 2020-05-24 LAB — COMPREHENSIVE METABOLIC PANEL
ALT: 31 IU/L (ref 0–32)
AST: 29 IU/L (ref 0–40)
Albumin/Globulin Ratio: 1.4 (ref 1.2–2.2)
Albumin: 4.4 g/dL (ref 3.8–4.8)
Alkaline Phosphatase: 92 IU/L (ref 44–121)
BUN/Creatinine Ratio: 22 (ref 12–28)
BUN: 23 mg/dL (ref 8–27)
Bilirubin Total: 0.5 mg/dL (ref 0.0–1.2)
CO2: 25 mmol/L (ref 20–29)
Calcium: 10.1 mg/dL (ref 8.7–10.3)
Chloride: 98 mmol/L (ref 96–106)
Creatinine, Ser: 1.03 mg/dL — ABNORMAL HIGH (ref 0.57–1.00)
GFR calc Af Amer: 68 mL/min/{1.73_m2} (ref >59)
GFR calc non Af Amer: 59 mL/min/{1.73_m2} — ABNORMAL LOW (ref >59)
Globulin, Total: 3.2 g/dL (ref 1.5–4.5)
Glucose: 310 mg/dL — ABNORMAL HIGH (ref 65–99)
Potassium: 4.8 mmol/L (ref 3.5–5.2)
Sodium: 137 mmol/L (ref 134–144)
Total Protein: 7.6 g/dL (ref 6.0–8.5)

## 2020-05-24 LAB — CBC WITH DIFFERENTIAL/PLATELET
Basophils Absolute: 0.1 10*3/uL (ref 0.0–0.2)
Basos: 1 %
EOS (ABSOLUTE): 0.2 10*3/uL (ref 0.0–0.4)
Eos: 2 %
Hematocrit: 43.5 % (ref 34.0–46.6)
Hemoglobin: 14.2 g/dL (ref 11.1–15.9)
Immature Grans (Abs): 0 10*3/uL (ref 0.0–0.1)
Immature Granulocytes: 0 %
Lymphocytes Absolute: 1.6 10*3/uL (ref 0.7–3.1)
Lymphs: 18 %
MCH: 31.6 pg (ref 26.6–33.0)
MCHC: 32.6 g/dL (ref 31.5–35.7)
MCV: 97 fL (ref 79–97)
Monocytes Absolute: 0.6 10*3/uL (ref 0.1–0.9)
Monocytes: 6 %
Neutrophils Absolute: 6.5 10*3/uL (ref 1.4–7.0)
Neutrophils: 73 %
Platelets: 283 10*3/uL (ref 150–450)
RBC: 4.49 x10E6/uL (ref 3.77–5.28)
RDW: 12.8 % (ref 11.7–15.4)
WBC: 8.9 10*3/uL (ref 3.4–10.8)

## 2020-05-24 LAB — HEMOGLOBIN A1C
Est. average glucose Bld gHb Est-mCnc: 226 mg/dL
Hgb A1c MFr Bld: 9.5 % — ABNORMAL HIGH (ref 4.8–5.6)

## 2020-05-24 LAB — LIPID PANEL
Chol/HDL Ratio: 5.1 ratio — ABNORMAL HIGH (ref 0.0–4.4)
Cholesterol, Total: 177 mg/dL (ref 100–199)
HDL: 35 mg/dL — ABNORMAL LOW (ref >39)
LDL Chol Calc (NIH): 102 mg/dL — ABNORMAL HIGH (ref 0–99)
Triglycerides: 232 mg/dL — ABNORMAL HIGH (ref 0–149)
VLDL Cholesterol Cal: 40 mg/dL (ref 5–40)

## 2020-05-24 LAB — TSH: TSH: 2.03 u[IU]/mL (ref 0.450–4.500)

## 2020-05-24 LAB — CARDIOVASCULAR RISK ASSESSMENT

## 2020-05-24 MED ORDER — RYBELSUS 7 MG PO TABS: 7.0000 mg | ORAL_TABLET | Freq: Every day | ORAL | 2 refills | Status: DC

## 2020-06-08 ENCOUNTER — Telehealth: Payer: Self-pay | Admitting: Neurology

## 2020-06-08 ENCOUNTER — Ambulatory Visit: Payer: 59 | Admitting: Neurology

## 2020-06-08 ENCOUNTER — Encounter: Payer: Self-pay | Admitting: Neurology

## 2020-06-08 VITALS — BP 137/78 | HR 88 | Ht 65.0 in | Wt 276.0 lb

## 2020-06-08 DIAGNOSIS — R29898 Other symptoms and signs involving the musculoskeletal system: Secondary | ICD-10-CM | POA: Diagnosis not present

## 2020-06-08 DIAGNOSIS — R200 Anesthesia of skin: Secondary | ICD-10-CM

## 2020-06-08 DIAGNOSIS — R270 Ataxia, unspecified: Secondary | ICD-10-CM

## 2020-06-08 DIAGNOSIS — M4802 Spinal stenosis, cervical region: Secondary | ICD-10-CM

## 2020-06-08 DIAGNOSIS — G992 Myelopathy in diseases classified elsewhere: Secondary | ICD-10-CM

## 2020-06-08 MED ORDER — ALPRAZOLAM 0.25 MG PO TABS: ORAL_TABLET | ORAL | 0 refills | Status: DC

## 2020-06-08 NOTE — Telephone Encounter (Signed)
open MRI  [employer] auth: [id number] (exp. 06/08/20 to 09/06/20) order faxed to [facility] they will reach out to the patient to schedule

## 2020-06-08 NOTE — Progress Notes (Addendum)
[name]    Provider:  Dr Lucia Gaskins Requesting Provider: Marianne Sofia, PA-C Primary Care Provider:  Marianne Sofia, PA-C  CC:  weakness  HPI:  Alexandra [name] is a 64 y.o. female here as requested by Marianne Sofia, PA-C for generalized weakness.  PMHx DM2, depression, RA.  I reviewed Mellody Dance notes: Patient has back pain, she fell in the shower, she been taking Tylenol, weakness of the right leg since April 2021, and numbness of the bilateral arms for 2 months constantly.  The fall was so bad that she hurt her back and rib area on the faucet, bruising, very sore, over the last several months she feels like she is dropping things, has had some tingling in the hands as well, denies any pain, past 5 months some weakness in her legs, her leg is getting give out on the right and she is losing her gait.  Right leg is the worst, all started in April, no inciting events, no trauma to start the symptoms, but has fallen since. She feels her leg buckles. Her hands are the worst, she can;t grasp anything, she says all her fingers are numb, she has a tingling vibrating area on the left arm. No nocturnal awakening, she has neck pain. No low back pain. All started happening at the same time, both hands symmetrical, both legs mostly on the right. Both hands are the same. No sensory change sin the right leg or left leg, weakness in the lower extremities. The hands are worsening. The legs are about the same. She has fallen, balance is poor, walking with a cane since about June or July because of her symptoms. No vision changes, no problems with eyes or eye lids, no facial weakness. No other focal neurologic deficits, associated symptoms, inciting events or modifiable factors.  Reviewed notes, labs and imaging from outside physicians, which showed: see above  hgba1c 9.5 tsh nml B12 267  Review of Systems: Patient complains of symptoms per HPI as well as the following symptoms numbness, weakness.  Pertinent negatives and positives per HPI. All others negative.   Social History   Socioeconomic History  . Marital status: Single    Spouse name: Not on file  . Number of children: Not on file  . Years of education: Not on file  . Highest education level: High school graduate  Occupational History  . Not on file  Tobacco Use  . Smoking status: Never Smoker  . Smokeless tobacco: Never Used  Vaping Use  . Vaping Use: Never used  Substance and Sexual Activity  . Alcohol use: Never  . Drug use: Never  . Sexual activity: Not on file  Other Topics Concern  . Not on file  Social History Narrative   Lives alone   Right handed   Caffeine: never   Social Determinants of Health   Financial Resource Strain:   . Difficulty of Paying Living Expenses: Not on file  Food Insecurity:   . Worried About Programme researcher, broadcasting/film/video in the Last Year: Not on file  . Ran Out of Food in the Last Year: Not on file  Transportation Needs:   . Lack of Transportation (Medical): Not on file  . Lack of Transportation (Non-Medical): Not on file  Physical Activity:   . Days of Exercise per Week: Not on file  . Minutes of Exercise per Session: Not on file  Stress:   . Feeling of Stress : Not on file  Social Connections:   .  Frequency of Communication with Friends and Family: Not on file  . Frequency of Social Gatherings with Friends and Family: Not on file  . Attends Religious Services: Not on file  . Active Member of Clubs or Organizations: Not on file  . Attends Banker Meetings: Not on file  . Marital Status: Not on file  Intimate Partner Violence:   . Fear of Current or Ex-Partner: Not on file  . Emotionally Abused: Not on file  . Physically Abused: Not on file  . Sexually Abused: Not on file    Family History  Problem Relation Age of Onset  . Emphysema Mother   . Cancer Father   . Neuropathy Neg Hx     Past Medical History:  Diagnosis Date  . Gastro-esophageal reflux  disease without esophagitis   . Major depressive disorder, single episode, mild (HCC)   . Mixed hyperlipidemia   . Other cholangitis   . Other fatigue   . Other specified rheumatoid arthritis, multiple sites (HCC)   . Perioral dermatitis   . Type 2 diabetes mellitus with other specified complication [facility])     Patient Active Problem List   Diagnosis Date Noted  . Encounter for immunization 05/23/2020  . Acute left-sided low back pain without sciatica 04/18/2020  . Generalized weakness 04/18/2020  . Paresthesia of both hands 04/18/2020  . Diabetes mellitus type II, non insulin dependent (HCC) 02/01/2020  . Mixed hyperlipidemia 02/01/2020  . Essential hypertension 02/01/2020  . Acute cystitis with hematuria 02/01/2020  . Postoperative examination 10/14/2017  . Calculus of gallbladder with chronic cholecystitis without obstruction 10/14/2017    Past Surgical History:  Procedure Laterality Date  . CHOLECYSTECTOMY    . ERCP      Current Outpatient Medications  Medication Sig Dispense Refill  . ascorbic acid (VITAMIN C) 500 MG tablet Take 500 mg by mouth daily.    . citalopram (CELEXA) 20 MG tablet TAKE 1 TABLET BY MOUTH DAILY 90 tablet 0  . folic acid (FOLVITE) 1 MG tablet Take 1 mg by mouth daily.    . metFORMIN (GLUCOPHAGE) 1000 MG tablet Take 1 tablet (1,000 mg total) by mouth 2 (two) times daily with a meal. 180 tablet 0  . methotrexate (RHEUMATREX) 2.5 MG tablet (10 weekly)    . montelukast (SINGULAIR) 10 MG tablet TAKE 1 TABLET BY MOUTH DAILY 90 tablet 3  . predniSONE (DELTASONE) 5 MG tablet Take 5 mg by mouth as needed (arthritis).    . ramipril (ALTACE) 5 MG capsule TAKE 1 CAPSULE BY MOUTH DAILY 90 capsule 0  . rosuvastatin (CRESTOR) 5 MG tablet TAKE 1 TABLET BY MOUTH DAILY 90 tablet 0  . Semaglutide (RYBELSUS) 7 MG TABS Take 7 mg by mouth daily. 30 tablet 2  . ALPRAZolam (XANAX) 0.25 MG tablet Take 1-2 tabs (0.25mg -0.50mg ) 30-60 minutes before procedure. May repeat if  needed.Do not drive. 4 tablet 0   No current facility-administered medications for this visit.    Allergies as of 06/08/2020  . (No Known Allergies)    Vitals: BP 137/78 (BP Location: Right Arm, Patient Position: Sitting, Cuff Size: Large)   Pulse 88   Ht 5\' 5"  (1.651 m)   Wt 276 lb (125.2 kg)   BMI 45.93 kg/m  Last Weight:  Wt Readings from Last 1 Encounters:  06/08/20 276 lb (125.2 kg)   Last Height:   Ht Readings from Last 1 Encounters:  06/08/20 5\' 5"  (1.651 m)     Physical exam: Exam: Gen:  NAD, conversant, well nourised, obese, well groomed                     CV: RRR, no MRG. No Carotid Bruits. No peripheral edema, warm, nontender Eyes: Conjunctivae clear without exudates or hemorrhage  Neuro: Detailed Neurologic Exam  Speech:    Speech is normal; fluent and spontaneous with normal comprehension.  Cognition:    The patient is oriented to person, place, and time;     recent and remote memory intact;     language fluent;     normal attention, concentration,     fund of knowledge Cranial Nerves:    The pupils are equal, round, and reactive to light. Pupils too small to visualize fundi. Visual fields are full to finger confrontation. Extraocular movements are intact. Trigeminal sensation is intact and the muscles of mastication are normal. The face is symmetric. The palate elevates in the midline. Hearing intact. Voice is normal. Shoulder shrug is normal. The tongue has normal motion without fasciculations.   Coordination:    No dysmetria or ataxia   Gait:    Guarded and slow, imbalanced, using a cane  Motor Observation:    No asymmetry, no atrophy, and no involuntary movements noted. Tone:    Increased upper extremity muscle tone.    Posture:    Posture is normal. normal erect    Strength: Difficult to assess proximal lower extremities due to morbid obesity however no focal deficit of strength throughout was noted     Sensation: decreased in hands  bilaterally     Reflex Exam:  DTR's:    Deep tendon reflexes in the upper and lower extremities are brisk bilaterally.   Toes:    Right toe up? Left downgoing.   Clonus:    Clonus is absent.    Assessment/Plan:  Patient with weakness in the hands and sensory loss, also weakness in in the legs.   - She appears to have a +Hoffman's and also brisk reflexes, she has RA I am worried about cervical spinal stenosis with myelopathy or SCD (B12 low-normal, have contacted pcp office see copy of email below)  - If negative may need to do an emg/ncs  - [facility] Dec Davis(pcp) has an approval for MRI so patient needs to follow up with Marianne Sofia as insurance will not approve another MRI brain with a current valid approval. But we will order the MRI cervical spine and get that scheduled. I do recommend she have the MRI of the brain as well.   (Email to Marianne Sofia:   Huntley Dec, I just noticed that you checked patient's B12 and it was 267. I wanted to let you know that even though that is technically normal, she may (likely) be B12 deficient. I would check an Methyl Malonic Acid (MMA) or just ask her to take B12 supplements. An elevated MMA is a very sensitive and specific marker of B12 deficiency. There is evidence that up to 10% of individuals who have normal or low normal B12 values (between 150 and 400 pg/mL) may develop neuropsychiatric sequelae of B12 deficiency without any evidence of megaloblastic anemia. The MMA test can identify this population of functionally B12-deficient individuals. When I order a B12 I always order an MMA as well. In these patients, oral cyanocobalamin 1-2 mg/day will normalize the MMA and hopefully improve their symptoms. I would have your office follow up on the B12 test you ordered.  Also,  I ordered an MRI of the cervical  spine. Unfortunately since you already had the MRI brain ordered then the brain scheduling has to go through your office, I can;t order MRI brain again or insurance  will decline it since you already ordered it and have a valid current approval. So would you office also follow up on the MRI of the brain you ordered and get her scheduled? We will take care of the MRI of the cervical spine. I worry she has some cervical spine disease, myelopathy of some sort. Low B12 can also cause a spinal lesion called subacute combined degeneration so I would ask your office to make sure you let her know about her B12. Thanks)  Orders Placed This Encounter  Procedures  . MR CERVICAL SPINE WO CONTRAST   Meds ordered this encounter  Medications  . ALPRAZolam (XANAX) 0.25 MG tablet    Sig: Take 1-2 tabs (0.25mg -0.50mg ) 30-60 minutes before procedure. May repeat if needed.Do not drive.    Dispense:  4 tablet    Refill:  0    Cc: Eduardo Osier, PA-C  Naomie Dean, MD  [facility] [address] [place], [place] [address]  Phone [phone] Fax [phone]

## 2020-06-08 NOTE — Patient Instructions (Addendum)
MRI of the cervical spine If negative may need emg/ncs (see below)  Alprazolam tablets What is this medicine? ALPRAZOLAM (al PRAY zoe lam) is a benzodiazepine. It is used to treat anxiety and panic attacks. This medicine may be used for other purposes; ask your health care provider or pharmacist if you have questions. COMMON BRAND NAME(S): Xanax What should I tell my health care provider before I take this medicine? They need to know if you have any of these conditions:  an alcohol or drug abuse problem  bipolar disorder, depression, psychosis or other mental health conditions  glaucoma  kidney or liver disease  lung or breathing disease  myasthenia gravis  Parkinson's disease  porphyria  seizures or a history of seizures  suicidal thoughts  an unusual or allergic reaction to alprazolam, other benzodiazepines, foods, dyes, or preservatives  pregnant or trying to get pregnant  breast-feeding How should I use this medicine? Take this medicine by mouth with a glass of water. Follow the directions on the prescription label. Take your medicine at regular intervals. Do not take it more often than directed. Do not stop taking except on your doctor's advice. A special MedGuide will be given to you by the pharmacist with each prescription and refill. Be sure to read this information carefully each time. Talk to your pediatrician regarding the use of this medicine in children. Special care may be needed. Overdosage: If you think you have taken too much of this medicine contact a poison control center or emergency room at once. NOTE: This medicine is only for you. Do not share this medicine with others. What if I miss a dose? If you miss a dose, take it as soon as you can. If it is almost time for your next dose, take only that dose. Do not take double or extra doses. What may interact with this medicine? Do not take this medicine with any of the following medications:  certain  antiviral medicines for HIV or AIDS like delavirdine, indinavir  certain medicines for fungal infections like ketoconazole and itraconazole  narcotic medicines for cough  sodium oxybate This medicine may also interact with the following medications:  alcohol  antihistamines for allergy, cough and cold  certain antibiotics like clarithromycin, erythromycin, isoniazid, rifampin, rifapentine, rifabutin, and troleandomycin  certain medicines for blood pressure, heart disease, irregular heart beat  certain medicines for depression, like amitriptyline, fluoxetine, sertraline  certain medicines for seizures like carbamazepine, oxcarbazepine, phenobarbital, phenytoin, primidone  cimetidine  cyclosporine  female hormones, like estrogens or progestins and birth control pills, patches, rings, or injections  general anesthetics like halothane, isoflurane, methoxyflurane, propofol  grapefruit juice  local anesthetics like lidocaine, pramoxine, tetracaine  medicines that relax muscles for surgery  narcotic medicines for pain  other antiviral medicines for HIV or AIDS  phenothiazines like chlorpromazine, mesoridazine, prochlorperazine, thioridazine This list may not describe all possible interactions. Give your health care provider a list of all the medicines, herbs, non-prescription drugs, or dietary supplements you use. Also tell them if you smoke, drink alcohol, or use illegal drugs. Some items may interact with your medicine. What should I watch for while using this medicine? Tell your doctor or health care professional if your symptoms do not start to get better or if they get worse. Do not stop taking except on your doctor's advice. You may develop a severe reaction. Your doctor will tell you how much medicine to take. You may get drowsy or dizzy. Do not drive, use machinery, or  do anything that needs mental alertness until you know how this medicine affects you. To reduce the risk  of dizzy and fainting spells, do not stand or sit up quickly, especially if you are an older patient. Alcohol may increase dizziness and drowsiness. Avoid alcoholic drinks. If you are taking another medicine that also causes drowsiness, you may have more side effects. Give your health care provider a list of all medicines you use. Your doctor will tell you how much medicine to take. Do not take more medicine than directed. Call emergency for help if you have problems breathing or unusual sleepiness. What side effects may I notice from receiving this medicine? Side effects that you should report to your doctor or health care professional as soon as possible:  allergic reactions like skin rash, itching or hives, swelling of the face, lips, or tongue  breathing problems  confusion  loss of balance or coordination  signs and symptoms of low blood pressure like dizziness; feeling faint or lightheaded, falls; unusually weak or tired  suicidal thoughts or other mood changes Side effects that usually do not require medical attention (report to your doctor or health care professional if they continue or are bothersome):  dizziness  dry mouth  nausea, vomiting  tiredness This list may not describe all possible side effects. Call your doctor for medical advice about side effects. You may report side effects to FDA at 1-800-FDA-1088. Where should I keep my medicine? Keep out of the reach of children. This medicine can be abused. Keep your medicine in a safe place to protect it from theft. Do not share this medicine with anyone. Selling or giving away this medicine is dangerous and against the law. Store at room temperature between 20 and 25 degrees C (68 and 77 degrees F). This medicine may cause accidental overdose and death if taken by other adults, children, or pets. Mix any unused medicine with a substance like cat litter or coffee grounds. Then throw the medicine away in a sealed container like  a sealed bag or a coffee can with a lid. Do not use the medicine after the expiration date. NOTE: This sheet is a summary. It may not cover all possible information. If you have questions about this medicine, talk to your doctor, pharmacist, or health care provider.  2020 Elsevier/Gold Standard (2015-05-04 13:47:25)  Electromyoneurogram Electromyoneurogram is a test to check how well your muscles and nerves are working. This procedure includes the combined use of electromyogram (EMG) and nerve conduction study (NCS). EMG is used to look for muscular disorders. NCS, which is also called electroneurogram, measures how well your nerves are controlling your muscles. The procedures are usually done together to check if your muscles and nerves are healthy. If the results of the tests are abnormal, this may indicate disease or injury, such as a neuromuscular disease or peripheral nerve damage. Tell a health care provider about:  Any allergies you have.  All medicines you are taking, including vitamins, herbs, eye drops, creams, and over-the-counter medicines.  Any problems you or family members have had with anesthetic medicines.  Any blood disorders you have.  Any surgeries you have had.  Any medical conditions you have.  If you have a pacemaker.  Whether you are pregnant or may be pregnant. What are the risks? Generally, this is a safe procedure. However, problems may occur, including:  Infection where the electrodes were inserted.  Bleeding. What happens before the procedure? Medicines Ask your health care provider about:  Changing or stopping your regular medicines. This is especially important if you are taking diabetes medicines or blood thinners.  Taking medicines such as aspirin and ibuprofen. These medicines can thin your blood. Do not take these medicines unless your health care provider tells you to take them.  Taking over-the-counter medicines, vitamins, herbs, and  supplements. General instructions  Your health care provider may ask you to avoid: ? Beverages that have caffeine, such as coffee and tea. ? Any products that contain nicotine or tobacco. These products include cigarettes, e-cigarettes, and chewing tobacco. If you need help quitting, ask your health care provider.  Do not use lotions or creams on the same day that you will be having the procedure. What happens during the procedure? For EMG   Your health care provider will ask you to stay in a position so that he or she can access the muscle that will be studied. You may be standing, sitting, or lying down.  You may be given a medicine that numbs the area (local anesthetic).  A very thin needle that has an electrode will be inserted into your muscle.  Another small electrode will be placed on your skin near the muscle.  Your health care provider will ask you to continue to remain still.  The electrodes will send a signal that tells about the electrical activity of your muscles. You may see this on a monitor or hear it in the room.  After your muscles have been studied at rest, your health care provider will ask you to contract or flex your muscles. The electrodes will send a signal that tells about the electrical activity of your muscles.  Your health care provider will remove the electrodes and the electrode needles when the procedure is finished. The procedure may vary among health care providers and hospitals. For NCS   An electrode that records your nerve activity (recording electrode) will be placed on your skin by the muscle that is being studied.  An electrode that is used as a reference (reference electrode) will be placed near the recording electrode.  A paste or gel will be applied to your skin between the recording electrode and the reference electrode.  Your nerve will be stimulated with a mild shock. Your health care provider will measure how much time it takes for  your muscle to react.  Your health care provider will remove the electrodes and the gel when the procedure is finished. The procedure may vary among health care providers and hospitals. What happens after the procedure?  It is up to you to get the results of your procedure. Ask your health care provider, or the department that is doing the procedure, when your results will be ready.  Your health care provider may: ? Give you medicines for any pain. ? Monitor the insertion sites to make sure that bleeding stops. Summary  Electromyoneurogram is a test to check how well your muscles and nerves are working.  If the results of the tests are abnormal, this may indicate disease or injury.  This is a safe procedure. However, problems may occur, such as bleeding and infection.  Your health care provider will do two tests to complete this procedure. One checks your muscles (EMG) and another checks your nerves (NCS).  It is up to you to get the results of your procedure. Ask your health care provider, or the department that is doing the procedure, when your results will be ready. This information is not intended to replace  advice given to you by your health care provider. Make sure you discuss any questions you have with your health care provider. Document Revised: 04/21/2018 Document Reviewed: 04/03/2018 Elsevier Patient Education  2020 [employer].

## 2020-06-08 NOTE — Telephone Encounter (Signed)
[facility] pending  

## 2020-06-10 ENCOUNTER — Other Ambulatory Visit: Payer: Self-pay | Admitting: Family Medicine

## 2020-06-10 DIAGNOSIS — R202 Paresthesia of skin: Secondary | ICD-10-CM

## 2020-06-12 NOTE — Telephone Encounter (Signed)
schedule for 06/16/20

## 2020-06-19 ENCOUNTER — Telehealth: Payer: Self-pay | Admitting: Neurology

## 2020-06-19 HISTORY — PX: CERVICAL SPINE SURGERY: SHX589

## 2020-06-19 NOTE — Telephone Encounter (Signed)
Request made today  

## 2020-06-19 NOTE — Telephone Encounter (Signed)
Pt. Called to find out if MRI results are in. She states it's getting harder for her to walk.

## 2020-06-19 NOTE — Telephone Encounter (Signed)
I called the pt back and advised that we have reached out to [facility] to have the  MRI results sent to our office. MRI completed on 06/16/20. I advised the pt in the meantime that if she is worsening she needs to go to the ER. I assured pt that we would call her when we get the results. Pt verbalized understanding and appreciation.

## 2020-06-21 ENCOUNTER — Other Ambulatory Visit: Payer: Self-pay | Admitting: Neurology

## 2020-06-21 DIAGNOSIS — G9589 Other specified diseases of spinal cord: Secondary | ICD-10-CM

## 2020-06-21 DIAGNOSIS — M4802 Spinal stenosis, cervical region: Secondary | ICD-10-CM

## 2020-06-21 NOTE — Telephone Encounter (Signed)
The pt returned my call. Discussed MRI c-spine shows cervical stenosis C3-4 and that per Dr Lucia Gaskins, Dr Fredrich Birks office is calling her today and will try to get her in tomorrow. Pt's questions were answered but I advised she defer her detailed surgical questions to Dr Venetia Maxon. She verbalized understanding and appreciation for the call.

## 2020-06-21 NOTE — Telephone Encounter (Signed)
MRI results show spinal stenosis and Dr Lucia Gaskins is reviewing. I called the pt under the instruction of Dr Lucia Gaskins and LVM (ok per [facility]) advising pt that she has pinching of the spinal cord in her neck and we sending her over to a neurosurgeon. I asked for a call back ASAP and left office number in the message.

## 2020-06-23 ENCOUNTER — Other Ambulatory Visit: Payer: Self-pay | Admitting: Neurosurgery

## 2020-06-26 ENCOUNTER — Ambulatory Visit: Payer: 59 | Admitting: Neurology

## 2020-06-26 ENCOUNTER — Other Ambulatory Visit (HOSPITAL_COMMUNITY)
Admission: RE | Admit: 2020-06-26 | Discharge: 2020-06-26 | Disposition: A | Discharge status: Home / Self Care | Payer: 59 | Source: Ambulatory Visit | Attending: Neurosurgery | Admitting: Neurosurgery

## 2020-06-26 ENCOUNTER — Encounter (HOSPITAL_COMMUNITY): Payer: Self-pay | Admitting: Neurosurgery

## 2020-06-26 DIAGNOSIS — Z01812 Encounter for preprocedural laboratory examination: Secondary | ICD-10-CM | POA: Insufficient documentation

## 2020-06-26 DIAGNOSIS — Z20822 Contact with and (suspected) exposure to covid-19: Secondary | ICD-10-CM | POA: Insufficient documentation

## 2020-06-26 LAB — SARS CORONAVIRUS 2 (TAT 6-24 HRS): SARS Coronavirus 2: NEGATIVE

## 2020-06-27 ENCOUNTER — Inpatient Hospital Stay (HOSPITAL_COMMUNITY)
Admission: AD | Admit: 2020-06-27 | Discharge: 2020-06-29 | DRG: 472 | Disposition: A | Discharge status: Home Health | Payer: 59 | Attending: Neurosurgery | Admitting: Neurosurgery

## 2020-06-27 ENCOUNTER — Ambulatory Visit (HOSPITAL_COMMUNITY): Payer: 59

## 2020-06-27 ENCOUNTER — Encounter (HOSPITAL_COMMUNITY)
Admission: AD | Disposition: A | Discharge status: Home Health | Payer: Self-pay | Source: Home / Self Care | Attending: Neurosurgery

## 2020-06-27 ENCOUNTER — Ambulatory Visit (HOSPITAL_COMMUNITY): Payer: 59 | Admitting: Certified Registered Nurse Anesthetist

## 2020-06-27 ENCOUNTER — Encounter (HOSPITAL_COMMUNITY): Payer: Self-pay | Admitting: Neurosurgery

## 2020-06-27 ENCOUNTER — Other Ambulatory Visit: Payer: Self-pay

## 2020-06-27 DIAGNOSIS — Z6841 Body Mass Index (BMI) 40.0 and over, adult: Secondary | ICD-10-CM

## 2020-06-27 DIAGNOSIS — Z20822 Contact with and (suspected) exposure to covid-19: Secondary | ICD-10-CM | POA: Diagnosis present

## 2020-06-27 DIAGNOSIS — M5031 Other cervical disc degeneration,  high cervical region: Secondary | ICD-10-CM | POA: Diagnosis present

## 2020-06-27 DIAGNOSIS — I10 Essential (primary) hypertension: Secondary | ICD-10-CM | POA: Diagnosis present

## 2020-06-27 DIAGNOSIS — G9520 Unspecified cord compression: Secondary | ICD-10-CM | POA: Diagnosis present

## 2020-06-27 DIAGNOSIS — M5001 Cervical disc disorder with myelopathy,  high cervical region: Secondary | ICD-10-CM | POA: Diagnosis present

## 2020-06-27 DIAGNOSIS — M5020 Other cervical disc displacement, unspecified cervical region: Secondary | ICD-10-CM | POA: Diagnosis present

## 2020-06-27 DIAGNOSIS — Z9049 Acquired absence of other specified parts of digestive tract: Secondary | ICD-10-CM

## 2020-06-27 DIAGNOSIS — E782 Mixed hyperlipidemia: Secondary | ICD-10-CM | POA: Diagnosis present

## 2020-06-27 DIAGNOSIS — Z419 Encounter for procedure for purposes other than remedying health state, unspecified: Secondary | ICD-10-CM

## 2020-06-27 DIAGNOSIS — K219 Gastro-esophageal reflux disease without esophagitis: Secondary | ICD-10-CM | POA: Diagnosis present

## 2020-06-27 DIAGNOSIS — M4802 Spinal stenosis, cervical region: Secondary | ICD-10-CM | POA: Diagnosis present

## 2020-06-27 DIAGNOSIS — M069 Rheumatoid arthritis, unspecified: Secondary | ICD-10-CM | POA: Diagnosis present

## 2020-06-27 DIAGNOSIS — G959 Disease of spinal cord, unspecified: Secondary | ICD-10-CM | POA: Diagnosis present

## 2020-06-27 DIAGNOSIS — E119 Type 2 diabetes mellitus without complications: Secondary | ICD-10-CM | POA: Diagnosis present

## 2020-06-27 DIAGNOSIS — Z7984 Long term (current) use of oral hypoglycemic drugs: Secondary | ICD-10-CM | POA: Diagnosis not present

## 2020-06-27 HISTORY — PX: ANTERIOR CERVICAL DECOMP/DISCECTOMY FUSION: SHX1161

## 2020-06-27 LAB — TYPE AND SCREEN
ABO/RH(D): O POS
Antibody Screen: NEGATIVE

## 2020-06-27 LAB — BASIC METABOLIC PANEL
Anion gap: 11 (ref 5–15)
BUN: 14 mg/dL (ref 8–23)
CO2: 24 mmol/L (ref 22–32)
Calcium: 9.8 mg/dL (ref 8.9–10.3)
Chloride: 102 mmol/L (ref 98–111)
Creatinine, Ser: 0.77 mg/dL (ref 0.44–1.00)
GFR, Estimated: >60 mL/min (ref >60)
Glucose, Bld: 276 mg/dL — ABNORMAL HIGH (ref 70–99)
Potassium: 4.8 mmol/L (ref 3.5–5.1)
Sodium: 137 mmol/L (ref 135–145)

## 2020-06-27 LAB — GLUCOSE, CAPILLARY
Glucose-Capillary: 261 mg/dL — ABNORMAL HIGH (ref 70–99)
Glucose-Capillary: 256 mg/dL — ABNORMAL HIGH (ref 70–99)
Glucose-Capillary: 253 mg/dL — ABNORMAL HIGH (ref 70–99)
Glucose-Capillary: 200 mg/dL — ABNORMAL HIGH (ref 70–99)
Glucose-Capillary: 230 mg/dL — ABNORMAL HIGH (ref 70–99)
Glucose-Capillary: 347 mg/dL — ABNORMAL HIGH (ref 70–99)

## 2020-06-27 LAB — CBC
HCT: 45 % (ref 36.0–46.0)
Hemoglobin: 14.8 g/dL (ref 12.0–15.0)
MCH: 31.6 pg (ref 26.0–34.0)
MCHC: 32.9 g/dL (ref 30.0–36.0)
MCV: 95.9 fL (ref 80.0–100.0)
Platelets: 308 10*3/uL (ref 150–400)
RBC: 4.69 MIL/uL (ref 3.87–5.11)
RDW: 12.8 % (ref 11.5–15.5)
WBC: 7.2 10*3/uL (ref 4.0–10.5)
nRBC: 0 % (ref 0.0–0.2)

## 2020-06-27 LAB — ABO/RH: ABO/RH(D): O POS

## 2020-06-27 LAB — SURGICAL PCR SCREEN
MRSA, PCR: NEGATIVE
Staphylococcus aureus: NEGATIVE

## 2020-06-27 SURGERY — ANTERIOR CERVICAL DECOMPRESSION/DISCECTOMY FUSION 1 LEVEL
Anesthesia: General | Site: Spine Cervical

## 2020-06-27 MED ORDER — ACETAMINOPHEN 10 MG/ML IV SOLN
INTRAVENOUS | Status: DC | PRN
  Administered 2020-06-27: 1000 mg via INTRAVENOUS

## 2020-06-27 MED ORDER — INSULIN REGULAR(HUMAN) IN NACL 100-0.9 UT/100ML-% IV SOLN
INTRAVENOUS | Status: AC
  Administered 2020-06-27: 16 [IU]/h via INTRAVENOUS
  Filled 2020-06-27: qty 100

## 2020-06-27 MED ORDER — THROMBIN 5000 UNITS EX SOLR
CUTANEOUS | Status: AC
  Filled 2020-06-27: qty 5000

## 2020-06-27 MED ORDER — SUCCINYLCHOLINE CHLORIDE 200 MG/10ML IV SOSY
PREFILLED_SYRINGE | INTRAVENOUS | Status: DC | PRN
  Administered 2020-06-27: 100 mg via INTRAVENOUS

## 2020-06-27 MED ORDER — ONDANSETRON HCL 4 MG/2ML IJ SOLN
INTRAMUSCULAR | Status: DC | PRN
  Administered 2020-06-27: 4 mg via INTRAVENOUS

## 2020-06-27 MED ORDER — LIDOCAINE-EPINEPHRINE 1 %-1:100000 IJ SOLN
INTRAMUSCULAR | Status: DC | PRN
  Administered 2020-06-27: 5 mL

## 2020-06-27 MED ORDER — AMISULPRIDE (ANTIEMETIC) 5 MG/2ML IV SOLN
INTRAVENOUS | Status: AC
  Filled 2020-06-27: qty 2

## 2020-06-27 MED ORDER — MENTHOL 3 MG MT LOZG
1.0000 | LOZENGE | OROMUCOSAL | Status: DC | PRN
  Filled 2020-06-27: qty 9

## 2020-06-27 MED ORDER — POLYETHYLENE GLYCOL 3350 17 G PO PACK: 17.0000 g | PACK | Freq: Every day | ORAL | Status: DC | PRN

## 2020-06-27 MED ORDER — LIDOCAINE 2% (20 MG/ML) 5 ML SYRINGE
INTRAMUSCULAR | Status: AC
  Filled 2020-06-27: qty 5

## 2020-06-27 MED ORDER — ACETAMINOPHEN 10 MG/ML IV SOLN: 1000.0000 mg | Freq: Once | INTRAVENOUS | Status: DC | PRN

## 2020-06-27 MED ORDER — BISACODYL 10 MG RE SUPP: 10.0000 mg | Freq: Every day | RECTAL | Status: DC | PRN

## 2020-06-27 MED ORDER — HYDROMORPHONE HCL 1 MG/ML IJ SOLN: 0.5000 mg | INTRAMUSCULAR | Status: DC | PRN

## 2020-06-27 MED ORDER — CHLORHEXIDINE GLUCONATE CLOTH 2 % EX PADS: 6.0000 | MEDICATED_PAD | Freq: Once | CUTANEOUS | Status: DC

## 2020-06-27 MED ORDER — RAMIPRIL 5 MG PO CAPS
5.0000 mg | ORAL_CAPSULE | Freq: Every day | ORAL | Status: DC
  Administered 2020-06-27 – 2020-06-29 (×3): 5 mg via ORAL
  Filled 2020-06-27 (×3): qty 1

## 2020-06-27 MED ORDER — ONDANSETRON HCL 4 MG PO TABS: 4.0000 mg | ORAL_TABLET | Freq: Four times a day (QID) | ORAL | Status: DC | PRN

## 2020-06-27 MED ORDER — CITALOPRAM HYDROBROMIDE 20 MG PO TABS
20.0000 mg | ORAL_TABLET | Freq: Every day | ORAL | Status: DC
  Administered 2020-06-27 – 2020-06-29 (×3): 20 mg via ORAL
  Filled 2020-06-27 (×3): qty 1

## 2020-06-27 MED ORDER — HYDROCODONE-ACETAMINOPHEN 5-325 MG PO TABS
2.0000 | ORAL_TABLET | ORAL | Status: DC | PRN
  Administered 2020-06-27 – 2020-06-28 (×2): 2 via ORAL
  Filled 2020-06-27 (×3): qty 2

## 2020-06-27 MED ORDER — SODIUM CHLORIDE 0.9% FLUSH: 3.0000 mL | Freq: Two times a day (BID) | INTRAVENOUS | Status: DC

## 2020-06-27 MED ORDER — CHLORHEXIDINE GLUCONATE 0.12 % MT SOLN
15.0000 mL | Freq: Once | OROMUCOSAL | Status: DC
  Filled 2020-06-27: qty 15

## 2020-06-27 MED ORDER — PANTOPRAZOLE SODIUM 40 MG IV SOLR: 40.0000 mg | Freq: Every day | INTRAVENOUS | Status: DC

## 2020-06-27 MED ORDER — ESMOLOL HCL 100 MG/10ML IV SOLN
INTRAVENOUS | Status: AC
  Filled 2020-06-27: qty 10

## 2020-06-27 MED ORDER — MIDAZOLAM HCL 2 MG/2ML IJ SOLN
INTRAMUSCULAR | Status: AC
  Filled 2020-06-27: qty 2

## 2020-06-27 MED ORDER — FENTANYL CITRATE (PF) 100 MCG/2ML IJ SOLN
INTRAMUSCULAR | Status: DC | PRN
  Administered 2020-06-27: 50 ug via INTRAVENOUS
  Administered 2020-06-27: 100 ug via INTRAVENOUS
  Administered 2020-06-27 (×2): 50 ug via INTRAVENOUS

## 2020-06-27 MED ORDER — SUGAMMADEX SODIUM 200 MG/2ML IV SOLN
INTRAVENOUS | Status: DC | PRN
  Administered 2020-06-27: 250 mg via INTRAVENOUS

## 2020-06-27 MED ORDER — INSULIN ASPART 100 UNIT/ML ~~LOC~~ SOLN
0.0000 [IU] | Freq: Every day | SUBCUTANEOUS | Status: DC
  Administered 2020-06-27: 4 [IU] via SUBCUTANEOUS
  Administered 2020-06-28: 5 [IU] via SUBCUTANEOUS

## 2020-06-27 MED ORDER — FLEET ENEMA 7-19 GM/118ML RE ENEM: 1.0000 | ENEMA | Freq: Once | RECTAL | Status: DC | PRN

## 2020-06-27 MED ORDER — OXYCODONE HCL 5 MG/5ML PO SOLN: 5.0000 mg | Freq: Once | ORAL | Status: DC | PRN

## 2020-06-27 MED ORDER — ZOLPIDEM TARTRATE 5 MG PO TABS: 5.0000 mg | ORAL_TABLET | Freq: Every evening | ORAL | Status: DC | PRN

## 2020-06-27 MED ORDER — HYDROMORPHONE HCL 1 MG/ML IJ SOLN
INTRAMUSCULAR | Status: AC
  Administered 2020-06-27: 0.5 mg via INTRAVENOUS
  Filled 2020-06-27: qty 1

## 2020-06-27 MED ORDER — ROCURONIUM BROMIDE 10 MG/ML (PF) SYRINGE
PREFILLED_SYRINGE | INTRAVENOUS | Status: DC | PRN
  Administered 2020-06-27: 50 mg via INTRAVENOUS

## 2020-06-27 MED ORDER — PROPOFOL 10 MG/ML IV BOLUS
INTRAVENOUS | Status: AC
  Filled 2020-06-27: qty 20

## 2020-06-27 MED ORDER — AMISULPRIDE (ANTIEMETIC) 5 MG/2ML IV SOLN
10.0000 mg | Freq: Once | INTRAVENOUS | Status: AC | PRN
  Administered 2020-06-27: 10 mg via INTRAVENOUS

## 2020-06-27 MED ORDER — ONDANSETRON HCL 4 MG/2ML IJ SOLN
4.0000 mg | Freq: Four times a day (QID) | INTRAMUSCULAR | Status: DC | PRN
  Administered 2020-06-27: 4 mg via INTRAVENOUS
  Filled 2020-06-27: qty 2

## 2020-06-27 MED ORDER — INSULIN ASPART 100 UNIT/ML ~~LOC~~ SOLN
0.0000 [IU] | Freq: Three times a day (TID) | SUBCUTANEOUS | Status: DC
  Administered 2020-06-28 (×2): 15 [IU] via SUBCUTANEOUS
  Administered 2020-06-28: 11 [IU] via SUBCUTANEOUS
  Administered 2020-06-29: 15 [IU] via SUBCUTANEOUS
  Administered 2020-06-29: 7 [IU] via SUBCUTANEOUS

## 2020-06-27 MED ORDER — EPHEDRINE 5 MG/ML INJ
INTRAVENOUS | Status: AC
  Filled 2020-06-27: qty 10

## 2020-06-27 MED ORDER — ONDANSETRON HCL 4 MG/2ML IJ SOLN
INTRAMUSCULAR | Status: AC
  Filled 2020-06-27: qty 2

## 2020-06-27 MED ORDER — ROCURONIUM BROMIDE 10 MG/ML (PF) SYRINGE
PREFILLED_SYRINGE | INTRAVENOUS | Status: AC
  Filled 2020-06-27: qty 10

## 2020-06-27 MED ORDER — METFORMIN HCL 500 MG PO TABS
1000.0000 mg | ORAL_TABLET | Freq: Two times a day (BID) | ORAL | Status: DC
  Administered 2020-06-28 – 2020-06-29 (×3): 1000 mg via ORAL
  Filled 2020-06-27 (×3): qty 2

## 2020-06-27 MED ORDER — ALUM & MAG HYDROXIDE-SIMETH 200-200-20 MG/5ML PO SUSP: 30.0000 mL | Freq: Four times a day (QID) | ORAL | Status: DC | PRN

## 2020-06-27 MED ORDER — LIDOCAINE 2% (20 MG/ML) 5 ML SYRINGE
INTRAMUSCULAR | Status: DC | PRN
  Administered 2020-06-27: 100 mg via INTRAVENOUS

## 2020-06-27 MED ORDER — SUCCINYLCHOLINE CHLORIDE 200 MG/10ML IV SOSY
PREFILLED_SYRINGE | INTRAVENOUS | Status: AC
  Filled 2020-06-27: qty 10

## 2020-06-27 MED ORDER — METHOCARBAMOL 1000 MG/10ML IJ SOLN
500.0000 mg | Freq: Four times a day (QID) | INTRAVENOUS | Status: DC | PRN
  Filled 2020-06-27: qty 5

## 2020-06-27 MED ORDER — ASCORBIC ACID 500 MG PO TABS
500.0000 mg | ORAL_TABLET | Freq: Every day | ORAL | Status: DC
  Administered 2020-06-28 – 2020-06-29 (×2): 500 mg via ORAL
  Filled 2020-06-27 (×3): qty 1

## 2020-06-27 MED ORDER — ROSUVASTATIN CALCIUM 5 MG PO TABS
5.0000 mg | ORAL_TABLET | Freq: Every day | ORAL | Status: DC
  Administered 2020-06-27 – 2020-06-29 (×3): 5 mg via ORAL
  Filled 2020-06-27 (×3): qty 1

## 2020-06-27 MED ORDER — PROPOFOL 10 MG/ML IV BOLUS
INTRAVENOUS | Status: DC | PRN
  Administered 2020-06-27: 150 mg via INTRAVENOUS

## 2020-06-27 MED ORDER — EPHEDRINE SULFATE-NACL 50-0.9 MG/10ML-% IV SOSY
PREFILLED_SYRINGE | INTRAVENOUS | Status: DC | PRN
  Administered 2020-06-27: 10 mg via INTRAVENOUS

## 2020-06-27 MED ORDER — HYDROXYZINE HCL 50 MG/ML IM SOLN: 50.0000 mg | Freq: Four times a day (QID) | INTRAMUSCULAR | Status: DC | PRN

## 2020-06-27 MED ORDER — DEXAMETHASONE SODIUM PHOSPHATE 10 MG/ML IJ SOLN
INTRAMUSCULAR | Status: AC
  Filled 2020-06-27: qty 2

## 2020-06-27 MED ORDER — THROMBIN 5000 UNITS EX SOLR
OROMUCOSAL | Status: DC | PRN
  Administered 2020-06-27: 5 mL via TOPICAL

## 2020-06-27 MED ORDER — FENTANYL CITRATE (PF) 250 MCG/5ML IJ SOLN
INTRAMUSCULAR | Status: AC
  Filled 2020-06-27: qty 5

## 2020-06-27 MED ORDER — CEFAZOLIN SODIUM-DEXTROSE 2-4 GM/100ML-% IV SOLN
2.0000 g | INTRAVENOUS | Status: DC
  Filled 2020-06-27: qty 100

## 2020-06-27 MED ORDER — ACETAMINOPHEN 650 MG RE SUPP: 650.0000 mg | RECTAL | Status: DC | PRN

## 2020-06-27 MED ORDER — DEXAMETHASONE SODIUM PHOSPHATE 10 MG/ML IJ SOLN
INTRAMUSCULAR | Status: DC | PRN
  Administered 2020-06-27: 10 mg via INTRAVENOUS

## 2020-06-27 MED ORDER — ONDANSETRON HCL 4 MG/2ML IJ SOLN: 4.0000 mg | Freq: Once | INTRAMUSCULAR | Status: DC | PRN

## 2020-06-27 MED ORDER — DEXTROSE 5 % IV SOLN
3.0000 g | INTRAVENOUS | Status: AC
  Administered 2020-06-27: 3 g via INTRAVENOUS
  Filled 2020-06-27: qty 3000

## 2020-06-27 MED ORDER — MONTELUKAST SODIUM 10 MG PO TABS
10.0000 mg | ORAL_TABLET | Freq: Every day | ORAL | Status: DC
  Administered 2020-06-27 – 2020-06-29 (×3): 10 mg via ORAL
  Filled 2020-06-27 (×3): qty 1

## 2020-06-27 MED ORDER — BUPIVACAINE HCL (PF) 0.5 % IJ SOLN
INTRAMUSCULAR | Status: DC | PRN
  Administered 2020-06-27: 5 mL

## 2020-06-27 MED ORDER — METHOCARBAMOL 500 MG PO TABS
500.0000 mg | ORAL_TABLET | Freq: Four times a day (QID) | ORAL | Status: DC | PRN
  Administered 2020-06-27 – 2020-06-28 (×2): 500 mg via ORAL
  Filled 2020-06-27 (×3): qty 1

## 2020-06-27 MED ORDER — DOCUSATE SODIUM 100 MG PO CAPS
100.0000 mg | ORAL_CAPSULE | Freq: Two times a day (BID) | ORAL | Status: DC
  Administered 2020-06-27 – 2020-06-29 (×4): 100 mg via ORAL
  Filled 2020-06-27 (×4): qty 1

## 2020-06-27 MED ORDER — PHENOL 1.4 % MT LIQD: 1.0000 | OROMUCOSAL | Status: DC | PRN

## 2020-06-27 MED ORDER — FOLIC ACID 1 MG PO TABS
1.0000 mg | ORAL_TABLET | Freq: Every day | ORAL | Status: DC
  Administered 2020-06-27 – 2020-06-29 (×3): 1 mg via ORAL
  Filled 2020-06-27 (×3): qty 1

## 2020-06-27 MED ORDER — SEMAGLUTIDE 7 MG PO TABS: 7.0000 mg | ORAL_TABLET | Freq: Every day | ORAL | Status: DC

## 2020-06-27 MED ORDER — PREDNISONE 5 MG PO TABS: 5.0000 mg | ORAL_TABLET | Freq: Every day | ORAL | Status: DC | PRN

## 2020-06-27 MED ORDER — 0.9 % SODIUM CHLORIDE (POUR BTL) OPTIME
TOPICAL | Status: DC | PRN
  Administered 2020-06-27: 1000 mL

## 2020-06-27 MED ORDER — KCL IN DEXTROSE-NACL 20-5-0.45 MEQ/L-%-% IV SOLN: INTRAVENOUS | Status: DC

## 2020-06-27 MED ORDER — ACETAMINOPHEN 325 MG PO TABS: 650.0000 mg | ORAL_TABLET | ORAL | Status: DC | PRN

## 2020-06-27 MED ORDER — HYDROMORPHONE HCL 1 MG/ML IJ SOLN
0.2500 mg | INTRAMUSCULAR | Status: DC | PRN
  Administered 2020-06-27: 0.5 mg via INTRAVENOUS

## 2020-06-27 MED ORDER — OXYCODONE HCL 5 MG PO TABS: 5.0000 mg | ORAL_TABLET | Freq: Once | ORAL | Status: DC | PRN

## 2020-06-27 MED ORDER — BUPIVACAINE HCL (PF) 0.5 % IJ SOLN
INTRAMUSCULAR | Status: AC
  Filled 2020-06-27: qty 30

## 2020-06-27 MED ORDER — SODIUM CHLORIDE 0.9 % IV SOLN: 250.0000 mL | INTRAVENOUS | Status: DC

## 2020-06-27 MED ORDER — LACTATED RINGERS IV SOLN: INTRAVENOUS | Status: DC

## 2020-06-27 MED ORDER — PANTOPRAZOLE SODIUM 40 MG PO TBEC
40.0000 mg | DELAYED_RELEASE_TABLET | Freq: Every day | ORAL | Status: DC
  Administered 2020-06-27 – 2020-06-28 (×2): 40 mg via ORAL
  Filled 2020-06-27 (×2): qty 1

## 2020-06-27 MED ORDER — SODIUM CHLORIDE 0.9% FLUSH: 3.0000 mL | INTRAVENOUS | Status: DC | PRN

## 2020-06-27 MED ORDER — OXYCODONE HCL 5 MG PO TABS: 5.0000 mg | ORAL_TABLET | ORAL | Status: DC | PRN

## 2020-06-27 MED ORDER — CEFAZOLIN SODIUM-DEXTROSE 2-4 GM/100ML-% IV SOLN
2.0000 g | Freq: Three times a day (TID) | INTRAVENOUS | Status: AC
  Administered 2020-06-27 – 2020-06-28 (×2): 2 g via INTRAVENOUS
  Filled 2020-06-27 (×2): qty 100

## 2020-06-27 MED ORDER — LIDOCAINE-EPINEPHRINE 1 %-1:100000 IJ SOLN
INTRAMUSCULAR | Status: AC
  Filled 2020-06-27: qty 1

## 2020-06-27 MED ORDER — ORAL CARE MOUTH RINSE: 15.0000 mL | Freq: Once | OROMUCOSAL | Status: DC

## 2020-06-27 MED ORDER — ACETAMINOPHEN 10 MG/ML IV SOLN
INTRAVENOUS | Status: AC
  Filled 2020-06-27: qty 100

## 2020-06-27 SURGICAL SUPPLY — 64 items
BAND RUBBER #18 3X1/16 STRL (MISCELLANEOUS) ×4 IMPLANT
BASKET BONE COLLECTION (BASKET) ×2 IMPLANT
BIT DRILL NEURO 2X3.1 SFT TUCH (MISCELLANEOUS) ×1 IMPLANT
BIT DRILL OZARK 12 (BIT) ×1 IMPLANT
BLADE ULTRA TIP 2M (BLADE) IMPLANT
BNDG GAUZE ELAST 4 BULKY (GAUZE/BANDAGES/DRESSINGS) IMPLANT
BUR BARREL STRAIGHT FLUTE 4.0 (BURR) ×2 IMPLANT
CANISTER SUCT 3000ML PPV (MISCELLANEOUS) ×2 IMPLANT
CARTRIDGE OIL MAESTRO DRILL (MISCELLANEOUS) ×1 IMPLANT
COVER MAYO STAND STRL (DRAPES) ×2 IMPLANT
COVER WAND RF STERILE (DRAPES) ×2 IMPLANT
DECANTER SPIKE VIAL GLASS SM (MISCELLANEOUS) ×2 IMPLANT
DERMABOND ADVANCED (GAUZE/BANDAGES/DRESSINGS) ×1
DERMABOND ADVANCED .7 DNX12 (GAUZE/BANDAGES/DRESSINGS) ×1 IMPLANT
DIFFUSER DRILL AIR PNEUMATIC (MISCELLANEOUS) ×2 IMPLANT
DRAPE HALF SHEET 40X57 (DRAPES) ×2 IMPLANT
DRAPE LAPAROTOMY 100X72 PEDS (DRAPES) ×2 IMPLANT
DRAPE MICROSCOPE LEICA (MISCELLANEOUS) ×2 IMPLANT
DRILL NEURO 2X3.1 SOFT TOUCH (MISCELLANEOUS) ×2
DRILL OZARK 12 (BIT) ×2
DRSG OPSITE POSTOP 3X4 (GAUZE/BANDAGES/DRESSINGS) ×2 IMPLANT
DURAPREP 6ML APPLICATOR 50/CS (WOUND CARE) ×2 IMPLANT
ELECT COATED BLADE 2.86 ST (ELECTRODE) ×2 IMPLANT
ELECT REM PT RETURN 9FT ADLT (ELECTROSURGICAL) ×2
ELECTRODE REM PT RTRN 9FT ADLT (ELECTROSURGICAL) ×1 IMPLANT
GAUZE 4X4 16PLY RFD (DISPOSABLE) IMPLANT
GAUZE SPONGE 4X4 12PLY STRL (GAUZE/BANDAGES/DRESSINGS) IMPLANT
GLOVE BIO SURGEON STRL SZ8 (GLOVE) ×2 IMPLANT
GLOVE BIOGEL PI IND STRL 7.0 (GLOVE) ×3 IMPLANT
GLOVE BIOGEL PI IND STRL 8 (GLOVE) ×3 IMPLANT
GLOVE BIOGEL PI IND STRL 8.5 (GLOVE) ×1 IMPLANT
GLOVE BIOGEL PI INDICATOR 7.0 (GLOVE) ×3
GLOVE BIOGEL PI INDICATOR 8 (GLOVE) ×3
GLOVE BIOGEL PI INDICATOR 8.5 (GLOVE) ×1
GLOVE ECLIPSE 6.5 STRL STRAW (GLOVE) ×8 IMPLANT
GLOVE ECLIPSE 7.5 STRL STRAW (GLOVE) ×4 IMPLANT
GLOVE ECLIPSE 8.0 STRL XLNG CF (GLOVE) ×2 IMPLANT
GLOVE EXAM NITRILE XL STR (GLOVE) IMPLANT
GOWN STRL REUS W/ TWL LRG LVL3 (GOWN DISPOSABLE) IMPLANT
GOWN STRL REUS W/ TWL XL LVL3 (GOWN DISPOSABLE) ×2 IMPLANT
GOWN STRL REUS W/TWL 2XL LVL3 (GOWN DISPOSABLE) ×4 IMPLANT
GOWN STRL REUS W/TWL LRG LVL3 (GOWN DISPOSABLE)
GOWN STRL REUS W/TWL XL LVL3 (GOWN DISPOSABLE) ×2
HALTER HD/CHIN CERV TRACTION D (MISCELLANEOUS) ×2 IMPLANT
HEMOSTAT POWDER KIT SURGIFOAM (HEMOSTASIS) ×2 IMPLANT
KIT BASIN OR (CUSTOM PROCEDURE TRAY) ×2 IMPLANT
KIT TURNOVER KIT B (KITS) ×2 IMPLANT
NEEDLE HYPO 25X1 1.5 SAFETY (NEEDLE) ×2 IMPLANT
NEEDLE SPNL 22GX3.5 QUINCKE BK (NEEDLE) ×4 IMPLANT
NS IRRIG 1000ML POUR BTL (IV SOLUTION) ×2 IMPLANT
OIL CARTRIDGE MAESTRO DRILL (MISCELLANEOUS) ×2
PACK LAMINECTOMY NEURO (CUSTOM PROCEDURE TRAY) ×2 IMPLANT
PAD ARMBOARD 7.5X6 YLW CONV (MISCELLANEOUS) ×6 IMPLANT
PEEK AVS AS 6X12X4 (Peek) ×2 IMPLANT
PIN DISTRACTION 14MM (PIN) ×4 IMPLANT
PLATE CERV CONS OZARK 1X20 (Plate) ×2 IMPLANT
SCREW CERV ST OZARK 4X12 (Screw) ×8 IMPLANT
SPONGE INTESTINAL PEANUT (DISPOSABLE) ×4 IMPLANT
SPONGE SURGIFOAM ABS GEL SZ50 (HEMOSTASIS) IMPLANT
STAPLER SKIN PROX WIDE 3.9 (STAPLE) ×2 IMPLANT
SUT VIC AB 3-0 SH 8-18 (SUTURE) ×4 IMPLANT
TOWEL GREEN STERILE (TOWEL DISPOSABLE) ×2 IMPLANT
TOWEL GREEN STERILE FF (TOWEL DISPOSABLE) ×2 IMPLANT
WATER STERILE IRR 1000ML POUR (IV SOLUTION) ×2 IMPLANT

## 2020-06-27 NOTE — Anesthesia Procedure Notes (Signed)
Procedure Name: Intubation Date/Time: 06/27/2020 3:36 PM Performed by: Ponciano Ort, CRNA Pre-anesthesia Checklist: Patient identified, Emergency Drugs available, Suction available and Patient being monitored Patient Re-evaluated:Patient Re-evaluated prior to induction Oxygen Delivery Method: Circle system utilized Preoxygenation: Pre-oxygenation with 100% oxygen Induction Type: IV induction Ventilation: Mask ventilation without difficulty Laryngoscope Size: Glidescope and 3 Grade View: Grade I Tube type: Oral Tube size: 7.5 mm Number of attempts: 1 Airway Equipment and Method: Stylet and Oral airway Placement Confirmation: ETT inserted through vocal cords under direct vision,  positive ETCO2 and breath sounds checked- equal and bilateral Secured at: 22 cm Tube secured with: Tape Dental Injury: Teeth and Oropharynx as per pre-operative assessment

## 2020-06-27 NOTE — Anesthesia Preprocedure Evaluation (Addendum)
Anesthesia Evaluation  Patient identified by MRN, date of birth, ID band Patient awake    Reviewed: Allergy & Precautions, NPO status , Patient's Chart, lab work & pertinent test results  Airway Mallampati: II  TM Distance: >3 FB Neck ROM: Full    Dental no notable dental hx. (+) Teeth Intact, Dental Advisory Given   Pulmonary    Pulmonary exam normal breath sounds clear to auscultation       Cardiovascular Exercise Tolerance: Good Normal cardiovascular exam Rhythm:Regular Rate:Normal  EKG SR R 86   Neuro/Psych PSYCHIATRIC DISORDERS Depression    GI/Hepatic GERD  ,  Endo/Other  diabetes, Well Controlled, Type 2, Oral Hypoglycemic AgentsMorbid obesity  Renal/GU      Musculoskeletal  (+) Arthritis ,   Abdominal (+) + obese,   Peds  Hematology   Anesthesia Other Findings   Reproductive/Obstetrics                            Anesthesia Physical Anesthesia Plan  ASA: III  Anesthesia Plan: General   Post-op Pain Management:    Induction: Intravenous  PONV Risk Score and Plan: 4 or greater and Treatment may vary due to age or medical condition, Ondansetron, Dexamethasone and Midazolam  Airway Management Planned: Video Laryngoscope Planned and Oral ETT  Additional Equipment:   Intra-op Plan:   Post-operative Plan: Extubation in OR  Informed Consent: I have reviewed the patients History and Physical, chart, labs and discussed the procedure including the risks, benefits and alternatives for the proposed anesthesia with the patient or authorized representative who has indicated his/her understanding and acceptance.     Dental advisory given  Plan Discussed with: CRNA and Anesthesiologist  Anesthesia Plan Comments: (GA)       Anesthesia Quick Evaluation

## 2020-06-27 NOTE — Brief Op Note (Signed)
06/27/2020 ° °5:24 PM ° °PATIENT:  Alexandra [name]  63 y.o. female ° °PRE-OPERATIVE DIAGNOSIS:  Cervical Myelopathy with herniated cervical disc at C 34 with cord compression and cord signal change.  Severe spinal stenosis with AP diameter of spinal canal of 3.6 mm ° °POST-OPERATIVE DIAGNOSIS: Cervical Myelopathy with herniated cervical disc at C 34 with cord compression and cord signal change.  Severe spinal stenosis with AP diameter of spinal canal of 3.6 mm ° °PROCEDURE:  Procedure(s): °Cervical three-four Anterior cervical decompression/discectomy/fusion (N/A) with 6 mm small lordotic  PEEK cage with autograft and anterior cervical plate ° °SURGEON:  Surgeon(s) and Role: °   * Kyros Salzwedel, MD - Primary ° °PHYSICIAN ASSISTANT: McDaniel, NP ° °ASSISTANTS: Poteat, RN  ° °ANESTHESIA:   general ° °EBL:  10 mL  ° °BLOOD ADMINISTERED:none ° °DRAINS: none  ° °LOCAL MEDICATIONS USED:  MARCAINE    and LIDOCAINE  ° °SPECIMEN:  No Specimen ° °DISPOSITION OF SPECIMEN:  N/A ° °COUNTS:  YES ° °TOURNIQUET:  * No tourniquets in log * ° °DICTATION: Patient is 63 year old female with severe disc herniation of C3 on 4 with stenosis, myelopathy, cord compression, cord signal change and AP diameter of the spinal canal of 3.6 mm.  It was elected to take her to surgery for anterior cervical decompression and fusion C 34 level.  This was done on an expedited basis due to progressive weakness, falling, and inability to walk.   ° °PROCEDURE: Patient was brought to operating room and following the smooth and uncomplicated induction of general endotracheal anesthesia his head was placed on a horseshoe head holder she was placed in 5 pounds of Holter traction and her anterior neck was prepped and draped in usual sterile fashion. An incision was made on the left side of midline after infiltrating the skin and subcutaneous tissues with local lidocaine. The platysmal layer was incised and subplatysmal dissection was performed exposing the anterior  border sternocleidomastoid muscle. Using blunt dissection the carotid sheath was kept lateral and trachea and esophagus kept medial exposing the anterior cervical spine. A bent spinal needle was placed it was felt to be the C 34 level and this was confirmed on intraoperative x-ray. Longus coli muscles were taken down from the anterior cervical spine using electrocautery and key elevator and self-retaining retractor was placed exposing the C 34 level. The interspace was incised and a thorough discectomy was performed. Distraction pins were placed. Uncinate spurs and central spondylitic ridges were drilled down with a high-speed drill. The spinal cord dura and both C4 nerve roots were widely decompressed.  Multiple fragments of herniated disc material causing severe cord compression were removed.   Hemostasis was assured. After trial sizing a 6 mm lordotic small footprint PEEK cage was selected and packed with local autograft. This was tamped into position and countersunk appropriately. Distraction weight was removed. A 20 mm Ozark anterior cervical plate was affixed to the cervical spine with 12 mm variable-angle screws 2 at C3, 2 at C4. All screws were well-positioned and locking mechanisms were engaged. A final X ray was obtained which showed well positioned graft and anterior plate without complicating features. Soft tissues were inspected and found to be in good repair. The wound was irrigated. The platysma layer was closed with 3-0 Vicryl stitches and the skin was reapproximated with 3-0 Vicryl subcuticular stitches. The wound was dressed with Dermabond and an occlusive dressing. Counts were correct at the end of the case. Patient was extubated and   taken to recovery in stable and satisfactory condition. ° ° °PLAN OF CARE: Admit to inpatient  ° °PATIENT DISPOSITION:  PACU - hemodynamically stable. °  °Delay start of Pharmacological VTE agent (>24hrs) due to surgical blood loss or risk of bleeding: yes ° °

## 2020-06-27 NOTE — Interval H&P Note (Signed)
History and Physical Interval Note:  06/27/2020 3:05 PM  Alexandra [name]  has presented today for surgery, with the diagnosis of Cervical Myelopathy.  The various methods of treatment have been discussed with the patient and family. After consideration of risks, benefits and other options for treatment, the patient has consented to  Procedure(s) with comments: Cervical 3-4 Anterior cervical decompression/discectomy/fusion (N/A) - 3C/RM 21 as a surgical intervention.  The patient's history has been reviewed, patient examined, no change in status, stable for surgery.  I have reviewed the patient's chart and labs.  Questions were answered to the patient's satisfaction.     [name]

## 2020-06-27 NOTE — Progress Notes (Signed)
Patient is awake, alert, conversant.  She appears to have improved bilateral hand intrinsic strength.  She says the numbness is not better. Patient is doing well after ACDF C 34 level.

## 2020-06-27 NOTE — Anesthesia Postprocedure Evaluation (Signed)
Anesthesia Post Note  Patient: Alexandra [name]  Procedure(s) Performed: Cervical three-four Anterior cervical decompression/discectomy/fusion (N/A Spine Cervical)     Patient location during evaluation: PACU Anesthesia Type: General Level of consciousness: sedated Pain management: pain level controlled Vital Signs Assessment: post-procedure vital signs reviewed and stable Respiratory status: spontaneous breathing and respiratory function stable Cardiovascular status: stable Postop Assessment: no apparent nausea or vomiting Anesthetic complications: no   No complications documented.  Last Vitals:  Vitals:   06/27/20 1730 06/27/20 1745  BP: (!) 196/102 (!) 161/84  Pulse: (!) 104 99  Resp: 10 12  Temp:    SpO2: 100% 96%    Last Pain:  Vitals:   06/27/20 1745  TempSrc:   PainSc: 5                  Luc Shammas DANIEL

## 2020-06-27 NOTE — Transfer of Care (Signed)
Immediate Anesthesia Transfer of Care Note  Patient: Alexandra [name]  Procedure(s) Performed: Cervical three-four Anterior cervical decompression/discectomy/fusion (N/A Spine Cervical)  Patient Location: PACU  Anesthesia Type:General  Level of Consciousness: awake, alert , oriented and patient cooperative  Airway & Oxygen Therapy: Patient Spontanous Breathing and Patient connected to face mask oxygen  Post-op Assessment: Report given to RN and Post -op Vital signs reviewed and stable  Post vital signs: Reviewed and stable  Last Vitals:  Vitals Value Taken Time  BP    Temp    Pulse    Resp    SpO2      Last Pain:  Vitals:   06/27/20 1403  TempSrc:   PainSc: 0-No pain         Complications: No complications documented.

## 2020-06-27 NOTE — Progress Notes (Signed)
Orthopedic Tech Progress Note Patient Details:  Alexandra [name] [birth year] [id number] Patient has brace Patient ID: [name], female   DOB: November 07, 1955, 64 y.o.   MRN: [id number]   Michelle Piper 06/27/2020, 7:28 PM

## 2020-06-27 NOTE — H&P (Signed)
Patient ID:   [id number] Patient: Alexandra [name]  Date of Birth: 1956-04-28 Visit Type: Office Visit   Date: 06/22/2020 03:00 PM Provider: Danae Orleans. Venetia Maxon MD   This 64 year old [name] presents for bilateral arm and hand numbness.  HISTORY OF PRESENT ILLNESS: 1.  bilateral arm and hand numbness  Alexandra [name] is a 64 year old [name] who was referred for neurosurgical evaluation by Dr. Lucia Gaskins for evaluation of bilateral hand numbness and difficulty with ambulation.  The patient reported that she fell in May or June of 2021 and has had progressively worsening symptoms ever since then.  Shortly after her fall she began to have difficulty with ambulation that required use of a cane.  However, over the last few weeks her ambulation has got significantly more difficult and now is using a walker.  She reports bilateral hand numbness at has been fairly constant since onset.  She does report that she has some mild cervical neck pain while walking.  Otherwise she denies any pain related to her symptoms.  Past surgical history:  Cholecystectomy 2019  Past medical history:  Rheumatoid arthritis, diabetes mellitus, hypertension  Medications:  Methotrexate 2.5 mg X 10 tablets, once per week       PAST MEDICAL/SURGICAL HISTORY:   (Detailed)      Family History:  (Detailed)   Social History:  (Detailed) Tobacco use reviewed. Preferred language is [place].   Tobacco use status: Current non-smoker. Smoking status: Never smoker.  SMOKING STATUS Type Smoking Status Usage Per Day Years Used Total Pack Years  Never smoker         MEDICATIONS: (added, continued or stopped this visit)   ALLERGIES: Ingredient Reaction Medication Name Comment NO KNOWN ALLERGIES    No known allergies. Reviewed, updated.    PHYSICAL EXAM:  Vitals Date Temp F BP Pulse Ht In Wt Lb BMI BSA Pain Score 06/22/2020  135/83 92 65 276 45.93  0/10   PHYSICAL EXAM  Details General Level of Distress: no acute distress Overall Appearance: normal    Cardiovascular Cardiac: regular rate and rhythm without murmur  Right Left  Carotid Pulses: normal normal  Respiratory Lungs: clear to auscultation  Neurological Orientation: normal Recent and Remote Memory: normal Attention Span and Concentration:   normal Language: normal Fund of Knowledge: normal  Right Left Sensation: normal normal Upper Extremity Coordination: normal normal  Lower Extremity Coordination: normal normal  Musculoskeletal Gait and Station: normal  Right Left Upper Extremity Muscle Strength: normal normal Lower Extremity Muscle Strength: normal normal Upper Extremity Muscle Tone:  normal normal Lower Extremity Muscle Tone: normal normal   Motor Strength Upper and lower extremity motor strength was tested in the clinically pertinent muscles. Any abnormal findings will be noted below.   Right Left Wrist Extensor: 4/5 4/5 Finger Extensor: 4+/5 4+/5   Deep Tendon Reflexes  Right Left Biceps: increase increase Triceps: increase increase Brachioradialis: increase increase Patellar: increase increase Achilles: increase increase  Sensory Sensation was tested at C2 to T1.   Cranial Nerves II. Optic Nerve/Visual Fields: normal III. Oculomotor: normal IV. Trochlear: normal V. Trigeminal: normal VI. Abducens: normal VII. Facial: normal VIII. Acoustic/Vestibular: normal IX. Glossopharyngeal: normal X. Vagus: normal XI. Spinal Accessory: normal XII. Hypoglossal: normal  Motor and other Tests Lhermittes: negative Rhomberg: negative Pronator drift: absent     Right Left Spurlings negative negative Hoffman's: present present Clonus: normal normal Babinski: normal normal SLR: negative negative Patrick's Pearlean Brownie): negative negative Toe Walk: normal normal Toe Lift: normal normal Heel Walk:  normal normal Tinels Elbow: negative negative Tinels  Wrist: negative negative Phalen: negative negative   Additional Findings:  Right hand intrinsics 4-/5, left hand intrinsics 4/5   DIAGNOSTIC RESULTS:  MRI cervical spine from 06/16/2020 reviewed with patient while in the office today.  There is a central disc rupture at the C3-4 level with resultant spinal stenosis and mild spinal cord compression, and bilateral foraminal stenosis.  Spinal stenosis measured at 3.6 millimeters.  There is a central disc protrusion at the C4-5 level with resultant bilateral foraminal stenosis.  There is severe foraminal stenosis on the left at C5-6.  There is moderate bilateral foraminal stenosis at C6-7.    IMPRESSION:  The patient has critical spinal stenosis at the C3-4 level with spinal cord compression.  She is myelopathic on exam and has a positive Hoffmann sign bilaterally.  She is experiencing significant difficulty with ambulation and requires the use of a walker.  Her symptoms have been progressively worsening since onset.  I have recommended the patient undergo an anterior cervical decompression and fusion of the C3-4 level to decompress her spinal cord.  Risks and benefits of the surgery were discussed with the patient in detail and she wishes to proceed with surgery.  PLAN: Proceed with ACDF of the C3-4 level.  Detailed patient Education was provided to the patient and her sister while in the office today.  The patient will follow-up in the clinic 3 weeks after discharge with A/P and lateral radiographs.  Orders: Diagnostic Procedures: Assessment Procedure G95.9 Cervical Spine- AP/Lat Instruction(s)/Education: Assessment Instruction R03.0 Lifestyle education [phone] Dietary management education, guidance, and counseling Miscellaneous: Assessment  G95.9 Soft Cervical Collar  Completed Orders (this encounter) Order Details Reason Side Interpretation Result Initial Treatment Date Region Lifestyle education Patient will monitor  and contact primary care physician if needed.       Dietary management education, guidance, and counseling Encouraged patient to eat well balanced diet.        Assessment/Plan  # Detail Type Description  1. Assessment Cervical spinal stenosis (M48.02).     2. Assessment Cervical myelopathy (G95.9).  Plan Orders Soft Cervical Collar.     3. Assessment Other cervical disc degeneration, unspecified cervical region (M50.30).     4. Assessment Herniated cervical disc (M50.20).     5. Assessment Elevated blood-pressure reading, w/o diagnosis of htn (R03.0).     6. Assessment Body mass index (BMI) 45.0-49.9, adult (Z68.42).  Plan Orders Today's instructions / counseling include(s) Dietary management education, guidance, and counseling. Clinical information/comments: Encouraged patient to eat well balanced diet.       Pain Management Plan Pain Scale: 0/10. Method: Numeric Pain Intensity Scale.              Provider:  Danae Orleans. Venetia Maxon MD  06/23/2020 09:42 AM    Dictation edited by: Benita Gutter, NP    CC Providers: Marianne Sofia [employer] [address] [place],  [place]  [address]-   Fia Hebert  [address] [place], [place] [address]               Electronically signed by Benita Gutter NP on 06/23/2020 09:42 AM

## 2020-06-27 NOTE — Op Note (Signed)
06/27/2020  5:24 PM  PATIENT:  [name]  64 y.o. female  PRE-OPERATIVE DIAGNOSIS:  Cervical Myelopathy with herniated cervical disc at C 34 with cord compression and cord signal change.  Severe spinal stenosis with AP diameter of spinal canal of 3.6 mm  POST-OPERATIVE DIAGNOSIS: Cervical Myelopathy with herniated cervical disc at C 34 with cord compression and cord signal change.  Severe spinal stenosis with AP diameter of spinal canal of 3.6 mm  PROCEDURE:  Procedure(s): Cervical three-four Anterior cervical decompression/discectomy/fusion (N/A) with 6 mm small lordotic  PEEK cage with autograft and anterior cervical plate  SURGEON:  Surgeon(s) and Role:    Maeola Harman, MD - Primary  PHYSICIAN ASSISTANT: Julien Girt, NP  ASSISTANTS: Poteat, RN   ANESTHESIA:   general  EBL:  10 mL   BLOOD ADMINISTERED:none  DRAINS: none   LOCAL MEDICATIONS USED:  MARCAINE    and LIDOCAINE   SPECIMEN:  No Specimen  DISPOSITION OF SPECIMEN:  N/A  COUNTS:  YES  TOURNIQUET:  * No tourniquets in log *  DICTATION: Patient is 64 year old female with severe disc herniation of C3 on 4 with stenosis, myelopathy, cord compression, cord signal change and AP diameter of the spinal canal of 3.6 mm.  It was elected to take her to surgery for anterior cervical decompression and fusion C 34 level.  This was done on an expedited basis due to progressive weakness, falling, and inability to walk.    PROCEDURE: Patient was brought to operating room and following the smooth and uncomplicated induction of general endotracheal anesthesia his head was placed on a horseshoe head holder she was placed in 5 pounds of Holter traction and her anterior neck was prepped and draped in usual sterile fashion. An incision was made on the left side of midline after infiltrating the skin and subcutaneous tissues with local lidocaine. The platysmal layer was incised and subplatysmal dissection was performed exposing the anterior  border sternocleidomastoid muscle. Using blunt dissection the carotid sheath was kept lateral and trachea and esophagus kept medial exposing the anterior cervical spine. A bent spinal needle was placed it was felt to be the C 34 level and this was confirmed on intraoperative x-ray. Longus coli muscles were taken down from the anterior cervical spine using electrocautery and key elevator and self-retaining retractor was placed exposing the C 34 level. The interspace was incised and a thorough discectomy was performed. Distraction pins were placed. Uncinate spurs and central spondylitic ridges were drilled down with a high-speed drill. The spinal cord dura and both C4 nerve roots were widely decompressed.  Multiple fragments of herniated disc material causing severe cord compression were removed.   Hemostasis was assured. After trial sizing a 6 mm lordotic small footprint PEEK cage was selected and packed with local autograft. This was tamped into position and countersunk appropriately. Distraction weight was removed. A 20 mm Ozark anterior cervical plate was affixed to the cervical spine with 12 mm variable-angle screws 2 at C3, 2 at C4. All screws were well-positioned and locking mechanisms were engaged. A final X ray was obtained which showed well positioned graft and anterior plate without complicating features. Soft tissues were inspected and found to be in good repair. The wound was irrigated. The platysma layer was closed with 3-0 Vicryl stitches and the skin was reapproximated with 3-0 Vicryl subcuticular stitches. The wound was dressed with Dermabond and an occlusive dressing. Counts were correct at the end of the case. Patient was extubated and  taken to recovery in stable and satisfactory condition.   PLAN OF CARE: Admit to inpatient   PATIENT DISPOSITION:  PACU - hemodynamically stable.   Delay start of Pharmacological VTE agent (>24hrs) due to surgical blood loss or risk of bleeding: yes

## 2020-06-28 ENCOUNTER — Encounter (HOSPITAL_COMMUNITY): Payer: Self-pay | Admitting: Neurosurgery

## 2020-06-28 LAB — GLUCOSE, CAPILLARY
Glucose-Capillary: 330 mg/dL — ABNORMAL HIGH (ref 70–99)
Glucose-Capillary: 286 mg/dL — ABNORMAL HIGH (ref 70–99)
Glucose-Capillary: 317 mg/dL — ABNORMAL HIGH (ref 70–99)
Glucose-Capillary: 385 mg/dL — ABNORMAL HIGH (ref 70–99)

## 2020-06-28 NOTE — Progress Notes (Signed)
Subjective: Patient reports that she is doing well and has had significant improvement in her preoperative symptoms. Bilateral hand numbness persists and reports mild incisional pain.   Objective: Vital signs in last 24 hours: Temp:  [97.5 F (36.4 C)-98.1 F (36.7 C)] 97.8 F (36.6 C) (11/10 0745) Pulse Rate:  [87-123] 107 (11/10 0745) Resp:  [10-18] 17 (11/10 0745) BP: (144-198)/(64-105) 144/64 (11/10 0745) SpO2:  [95 %-100 %] 95 % (11/10 0745) Weight:  [125.2 kg] 125.2 kg (11/09 1301)  Intake/Output from previous day: 11/09 0701 - 11/10 0700 In: 1050 [I.V.:900; IV Piggyback:150] Out: 10 [Blood:10] Intake/Output this shift: No intake/output data recorded.  Physical Exam: Patient is awake, A/O X 4, and conversant.  She is MAEW with good power. She appears to have improved bilateral hand intrinsic strength Left > Right.  She reports an increased strength in her BLE and the ability to ambulate with less assistance. Dressing is CDI. Soft cervical collar in use.   Lab Results: Recent Labs    06/27/20 1429  WBC 7.2  HGB 14.8  HCT 45.0  PLT 308   BMET Recent Labs    06/27/20 1429  NA 137  K 4.8  CL 102  CO2 24  GLUCOSE 276*  BUN 14  CREATININE 0.77  CALCIUM 9.8    Studies/Results: DG Cervical Spine 2-3 Views  Result Date: 06/27/2020 CLINICAL DATA:  C3-C4 anterior fusion EXAM: CERVICAL SPINE - 2-3 VIEW COMPARISON:  June 26, 2020 FINDINGS: An initial cross-table lateral cervical image time stamped 4:00:52 p.m. shows a metallic probe tip overlying the C3-4 interspace from an anterior approach. No fracture or spondylolisthesis is evident in the visualized cervical region. The lower cervical spine is obscured by shoulder artifact. A second lateral lumbar image time stamped 4:31:05 submitted. There is anterior screw and plate fixation at C3 and C4 with a disc spacer at C3-4. No fracture or spondylolisthesis in areas visualized. Lower cervical spine not visualized due to  shoulder artifact. IMPRESSION: Anterior screw and plate fixation at C3 and C4 with disc spacer at C3-4. Support hardware intact. No fracture or spondylolisthesis through the C4 level. Inferior to C4, shoulder artifact precludes visualization. Electronically Signed   By: Bretta Bang III M.D.   On: 06/27/2020 18:21    Assessment/Plan: Patient is post op day 1 s/p ACDF C3/4 level. She is doing well and has had an improvement in her BUE and BLE strength. Bilateral hand numbness persists. Begin working with therapies. Continue soft cervical collar. Encourage ambulation and mobility. Will plan for discharge tomorrow.    LOS: 1 day     Council Mechanic, DNP, NP-C  06/28/2020, 8:52 AM

## 2020-06-28 NOTE — Evaluation (Signed)
Occupational Therapy Evaluation Patient Details Name: Alexandra [name] MRN: [id number] DOB: [birth year] Today's Date: 06/28/2020    History of Present Illness 64 y.o. female underwent cervical three-four Anterior cervical decompression/discectomy/fusion (N/A) with 6 mm small lordotic  PEEK cage with autograft and anterior cervical plate 24/2.   Clinical Impression   Patient admitted for above diagnosis and procedure.  She believes her lower body strength and sensation are better, she is moving her hands better, and is able to use them for functional tasks.  She states she could not hold utensils, comb her hair, or brush her teeth, all of which she did today.  She plans on discharging to her sister's tomorrow.  OT will follow up in the morning to ensure carry over and compliance with education taught.  No further OT post acute should be needed.      Follow Up Recommendations  No OT follow up;Supervision - Intermittent    Equipment Recommendations       Recommendations for Other Services       Precautions / Restrictions Precautions Precautions: Cervical;Fall Precaution Booklet Issued: Yes (comment) Required Braces or Orthoses: Cervical Brace Cervical Brace: Soft collar;For comfort Restrictions Weight Bearing Restrictions: No      Mobility Bed Mobility Overal bed mobility: Modified Independent                  Transfers Overall transfer level: Modified independent                    Balance Overall balance assessment: No apparent balance deficits (not formally assessed)                                         ADL either performed or assessed with clinical judgement   ADL Overall ADL's : Modified independent                                       General ADL Comments: Mobility with RW.  Able to complete toiletng task, self feeding and grooming at Mod I level.     Vision Baseline Vision/History: Wears glasses Wears Glasses: At  all times Patient Visual Report: No change from baseline       Perception     Praxis      Pertinent Vitals/Pain Pain Assessment: Faces Faces Pain Scale: Hurts little more Pain Location: Neck Pain Descriptors / Indicators: Aching Pain Intervention(s): Monitored during session     Hand Dominance Right   Extremity/Trunk Assessment Upper Extremity Assessment Upper Extremity Assessment: Overall WFL for tasks assessed (Weakness to B grip, but improved per patient)   Lower Extremity Assessment Lower Extremity Assessment: Defer to PT evaluation   Cervical / Trunk Assessment Cervical / Trunk Assessment: Normal   Communication Communication Communication: No difficulties   Cognition Arousal/Alertness: Awake/alert Behavior During Therapy: WFL for tasks assessed/performed Overall Cognitive Status: Within Functional Limits for tasks assessed                                                      Home Living Family/patient expects to be discharged to:: Other (Comment) (Sisters Home) Living Arrangements: Other relatives  Additional Comments: Will stay with sister until she believes she can return home      Prior Functioning/Environment Level of Independence: Independent with assistive device(s)        Comments: RW and increased assist via sister.        OT Problem List: Decreased strength      OT Treatment/Interventions:      OT Goals(Current goals can be found in the care plan section) Acute Rehab OT Goals Patient Stated Goal: move better and return to work OT Goal Formulation: With patient Time For Goal Achievement: 08/07/20 ADL Goals Pt Will Perform Upper Body Bathing: with modified independence Pt Will Perform Lower Body Bathing: with modified independence Pt Will Perform Upper Body Dressing: with modified independence Pt Will Perform Lower Body Dressing: with modified independence Pt Will Transfer  to Toilet: with modified independence  OT Frequency:     Barriers to D/C:  none noted          Co-evaluation              AM-PAC OT "6 Clicks" Daily Activity     Outcome Measure Help from another person eating meals?: None Help from another person taking care of personal grooming?: None Help from another person toileting, which includes using toliet, bedpan, or urinal?: None Help from another person bathing (including washing, rinsing, drying)?: None Help from another person to put on and taking off regular upper body clothing?: None Help from another person to put on and taking off regular lower body clothing?: None 6 Click Score: 24   End of Session Equipment Utilized During Treatment: Cervical collar Nurse Communication: Other (comment) (discharge tomorrow)  Activity Tolerance: Patient tolerated treatment well Patient left: in bed  OT Visit Diagnosis: Unsteadiness on feet (R26.81)                Time: 5208-0223 OT Time Calculation (min): 27 min Charges:  OT General Charges $OT Visit: 1 Visit OT Evaluation $OT Eval Moderate Complexity: 1 Mod  06/28/2020  Rich, OTR/L  Acute Rehabilitation Services  Office:  [phone]   Suzanna Obey 06/28/2020, 9:33 AM

## 2020-06-28 NOTE — Progress Notes (Signed)
Inpatient Diabetes Program Recommendations  AACE/ADA: New Consensus Statement on Inpatient Glycemic Control (2015)  Target Ranges:  Prepandial:   less than 140 mg/dL      Peak postprandial:   less than 180 mg/dL (1-2 hours)      Critically ill patients:  140 - 180 mg/dL   Lab Results  Component Value Date   GLUCAP 286 (H) 06/28/2020   HGBA1C 9.5 (H) 05/23/2020    Review of Glycemic Control Results for Alexandra [name], Alexandra [name] (MRN [id number]) as of 06/28/2020 12:31  Ref. Range 06/27/2020 16:51 06/27/2020 17:29 06/27/2020 21:16 06/28/2020 06:47 06/28/2020 11:21  Glucose-Capillary Latest Ref Range: 70 - 99 mg/dL 329 (H) 191 (H) 660 (H) 330 (H) 286 (H)   Diabetes history:  DM2 Outpatient Diabetes medications:  Metformin 1000 mg bid Rybelsus 7 mg daily Current orders for Inpatient glycemic control:  Novolog 0-20 units tid & 0-5 qhs,  Metformin 1000 mg bid Semaglutide 7 mg daily Prednisone 5 mg daily PRN  Note:  Spoke with patient at bedside.  Reviewed patient's current A1c of 9.5% (average blood sugar of 226 mg/dL). Explained what a A1c is and what it measures. Also reviewed goal A1c with patient, importance of good glucose control @ home, and blood sugar goals. She states that is what her A1C usually runs.  She is current with her PCP and sees her every 3 mos.  She does not check her blood sugars at home.  She does have a functioning meter.  Encouraged her to check fasting and mid afternoon and bring meter to PCP appointments for review.    She denies drinking any beverages with sugar.  She is aware to modify/limit CHO's.  We reviewed The Plate Method.  She admits to not eating well.  Encouraged 15-20 min walks once medically cleared.  This can lower her A1C 1 whole point.  She denies difficulties obtaining medications and takes as prescribed.  RN is going to order LWWD booklet prior to DC.    Will continue to follow while inpatient.  Thank you, Dulce Sellar, RN, BSN Diabetes Coordinator Inpatient  Diabetes Program [phone] (team pager from 8a-5p)

## 2020-06-28 NOTE — Evaluation (Addendum)
Physical Therapy Evaluation Patient Details Name: Alexandra [name] MRN: [id number] DOB: 13-Mar-1956 Today's Date: 06/28/2020   History of Present Illness  64 y.o. female underwent cervical three-four Anterior cervical decompression/discectomy/fusion (N/A) with 6 mm small lordotic  PEEK cage with autograft and anterior cervical plate 42/6.  Clinical Impression   Patient is s/p above surgery resulting in functional limitations due to the deficits listed below (see PT Problem List). Alexandra [name] has been functionally independent until recently with the onset of bilateral numbness and difficulty walking; leading up to the surgery, she needed to use a RW, and had extreme difficulty with household ambulation; Presents to PT with pain/discomfort in neck, Bil LE weakness (though grossly improved per patient), and functional dependence; Plans to dc to sister's home, where she has 3 steps to enter and no rails;  Overall, she seems pleased with the surgery, and reports improvements in strength;  Patient will benefit from skilled PT to increase their independence and safety with mobility to allow discharge to the venue listed below.       Follow Up Recommendations Home health PT;Supervision - Intermittent    Equipment Recommendations  Rolling walker with 5" wheels;3in1 (PT);Other (comment) (I believe she already has a RW)    Recommendations for Other Services       Precautions / Restrictions Precautions Precautions: Cervical;Fall Required Braces or Orthoses: Cervical Brace Cervical Brace: Soft collar;For comfort      Mobility  Bed Mobility Overal bed mobility: Modified Independent                  Transfers Overall transfer level: Needs assistance Equipment used: Rolling walker (2 wheeled) Transfers: Sit to/from Stand Sit to Stand: Supervision         General transfer comment: Supervision for safety  Ambulation/Gait Ambulation/Gait assistance: Min guard;Supervision (minguard  progressing to Supervision) Gait Distance (Feet): 110 Feet Assistive device: Rolling walker (2 wheeled) Gait Pattern/deviations: Step-through pattern;Decreased stride length Gait velocity: slowed   General Gait Details: Good use of RW for support; cues to self-monitor for activity tolerance  Stairs            Wheelchair Mobility    Modified Rankin (Stroke Patients Only)       Balance Overall balance assessment: Mild deficits observed, not formally tested                                           Pertinent Vitals/Pain Pain Assessment: Faces Faces Pain Scale: Hurts a little bit Pain Location: Neck Pain Descriptors / Indicators: Aching Pain Intervention(s): Monitored during session    Home Living Family/patient expects to be discharged to:: Private residence [facility]) Living Arrangements: Other relatives Available Help at Discharge: Family;Available PRN/intermittently Type of Home: House Home Access: Stairs to enter Entrance Stairs-Rails: None Entrance Stairs-Number of Steps: 3 Home Layout: One level Home Equipment: Walker - 2 wheels;Shower seat Additional Comments: Will stay with sister until she believes she can return home    Prior Function Level of Independence: Independent with assistive device(s)         Comments: RW and increased assist via sister.     Hand Dominance   Dominant Hand: Right    Extremity/Trunk Assessment   Upper Extremity Assessment Upper Extremity Assessment: Defer to OT evaluation    Lower Extremity Assessment Lower Extremity Assessment: Generalized weakness       Communication  Communication: No difficulties  Cognition Arousal/Alertness: Awake/alert Behavior During Therapy: WFL for tasks assessed/performed Overall Cognitive Status: Within Functional Limits for tasks assessed                                        General Comments      Exercises     Assessment/Plan    PT  Assessment Patient needs continued PT services  PT Problem List Decreased strength;Decreased range of motion;Decreased activity tolerance;Decreased balance;Decreased mobility;Decreased knowledge of use of DME;Decreased knowledge of precautions;Obesity;Pain       PT Treatment Interventions DME instruction;Gait training;Stair training;Functional mobility training;Therapeutic activities;Therapeutic exercise;Patient/family education    PT Goals (Current goals can be found in the Care Plan section)  Acute Rehab PT Goals Patient Stated Goal: move better and return to work PT Goal Formulation: With patient Time For Goal Achievement: 07/05/20 Potential to Achieve Goals: Good    Frequency Min 5X/week   Barriers to discharge        Co-evaluation               AM-PAC PT "6 Clicks" Mobility  Outcome Measure Help needed turning from your back to your side while in a flat bed without using bedrails?: None Help needed moving from lying on your back to sitting on the side of a flat bed without using bedrails?: None Help needed moving to and from a bed to a chair (including a wheelchair)?: A Little Help needed standing up from a chair using your arms (e.g., wheelchair or bedside chair)?: A Little Help needed to walk in hospital room?: A Little Help needed climbing 3-5 steps with a railing? : A Lot 6 Click Score: 19    End of Session   Activity Tolerance: Patient tolerated treatment well Patient left: in chair;with call bell/phone within reach Nurse Communication: Mobility status PT Visit Diagnosis: Other abnormalities of gait and mobility (R26.89);Muscle weakness (generalized) (M62.81)    Time: 1478-2956 PT Time Calculation (min) (ACUTE ONLY): 26 min   Charges:   PT Evaluation $PT Eval Low Complexity: 1 Low PT Treatments $Gait Training: 8-22 mins        Van Clines, PT  Acute Rehabilitation Services Pager [phone] Office [phone]   Levi Aland 06/28/2020, 4:06  PM

## 2020-06-28 NOTE — Progress Notes (Signed)
CSW received consult regarding financial assistance. CSW placed follow-up resources on AVS for patient.   Kennette Cuthrell LCSW

## 2020-06-29 ENCOUNTER — Other Ambulatory Visit (HOSPITAL_COMMUNITY): Payer: Self-pay | Admitting: Neurosurgery

## 2020-06-29 LAB — GLUCOSE, CAPILLARY
Glucose-Capillary: 224 mg/dL — ABNORMAL HIGH (ref 70–99)
Glucose-Capillary: 329 mg/dL — ABNORMAL HIGH (ref 70–99)

## 2020-06-29 MED ORDER — HYDROCODONE-ACETAMINOPHEN 5-325 MG PO TABS: 2.0000 | ORAL_TABLET | ORAL | 0 refills | Status: DC | PRN

## 2020-06-29 MED ORDER — METHOCARBAMOL 500 MG PO TABS: 500.0000 mg | ORAL_TABLET | Freq: Four times a day (QID) | ORAL | 0 refills | Status: DC | PRN

## 2020-06-29 MED FILL — HYDROCODON-APAP 5-325: 5-325 | 5 days supply | Qty: 30 | Fill #0

## 2020-06-29 NOTE — Discharge Instructions (Signed)

## 2020-06-29 NOTE — Progress Notes (Signed)
Patient is discharged from room 3C05 at this time. Alert and in stable condition. IV site d/c'd and instructions read to patient with understanding verbalized and all questions answered. Left unit via wheelchair with all belongings at side.  

## 2020-06-29 NOTE — TOC Initial Note (Signed)
Transition of Care [facility]) - Initial/Assessment Note    Patient Details  Name: Alexandra [name] MRN: [id number] Date of Birth: [birth year]  [facility] CM/SW Contact:    Mearl Latin, LCSW Phone Number: 06/29/2020, 11:34 AM  Clinical Narrative:                 CSW received consult for possible home health services at time of discharge. [facility] is in network with patient's insurance and is able to accept her for Home Health PT. Info placed on AVS. CSW confirmed PCP and address. No further questions reported at this time.       Expected Discharge Plan: Home w Home Health Services Barriers to Discharge: No Barriers Identified   Patient Goals and CMS Choice Patient states their goals for this hospitalization and ongoing recovery are:: return home CMS Medicare.gov Compare Post Acute Care list provided to:: Patient Choice offered to / list presented to : Patient  Expected Discharge Plan and Services Expected Discharge Plan: Home w Home Health Services   Discharge Planning Services: CM Consult Post Acute Care Choice: Home Health Living arrangements for the past 2 months: Single Family Home Expected Discharge Date: 06/29/20                         HH Arranged: PT HH Agency: [facility] Date [facility] Agency Contacted: 06/29/20 Time HH Agency Contacted: [id number] Representative spoke with at [facility] Agency: [facility]  Prior Living Arrangements/Services Living arrangements for the past 2 months: Single Family Home Lives with:: Self Patient language and need for interpreter reviewed:: Yes Do you feel safe going back to the place where you live?: Yes      Need for Family Participation in Patient Care: No (Comment) Care giver support system in place?: Yes (comment)   Criminal Activity/Legal Involvement Pertinent to Current Situation/Hospitalization: No - Comment as needed  Activities of Daily Living Home Assistive Devices/Equipment: Eyeglasses, Environmental consultant (specify type), Shower  chair with back ADL Screening (condition at time of admission) Patient's cognitive ability adequate to safely complete daily activities?: Yes Is the patient deaf or have difficulty hearing?: No Does the patient have difficulty seeing, even when wearing glasses/contacts?: No Does the patient have difficulty concentrating, remembering, or making decisions?: No Patient able to express need for assistance with ADLs?: Yes Does the patient have difficulty dressing or bathing?: No Independently performs ADLs?: No Communication: Independent Dressing (OT): Needs assistance Is this a change from baseline?: Pre-admission baseline Grooming: Independent Is this a change from baseline?: Pre-admission baseline Feeding: Independent Bathing: Needs assistance Is this a change from baseline?: Pre-admission baseline Toileting: Needs assistance Is this a change from baseline?: Pre-admission baseline In/Out Bed: Needs assistance Is this a change from baseline?: Pre-admission baseline Walks in Home: Needs assistance Is this a change from baseline?: Pre-admission baseline Does the patient have difficulty walking or climbing stairs?: Yes Weakness of Legs: None Weakness of Arms/Hands: None  Permission Sought/Granted Permission sought to share information with : Facility Industrial/product designer granted to share information with : Yes, Verbal Permission Granted     Permission granted to share info w AGENCY: HH        Emotional Assessment Appearance:: Appears stated age Attitude/Demeanor/Rapport:  (Appropriate) Affect (typically observed): Accepting, Appropriate Orientation: : Oriented to Self, Oriented to Place, Oriented to  Time, Oriented to Situation Alcohol / Substance Use: Not Applicable Psych Involvement: No (comment)  Admission diagnosis:  Cervical myelopathy (HCC) [G95.9] Patient Active  Problem List   Diagnosis Date Noted  . Cervical myelopathy (HCC) 06/27/2020  . Encounter for  immunization 05/23/2020  . Acute left-sided low back pain without sciatica 04/18/2020  . Generalized weakness 04/18/2020  . Paresthesia of both hands 04/18/2020  . Diabetes mellitus type II, non insulin dependent (HCC) 02/01/2020  . Mixed hyperlipidemia 02/01/2020  . Essential hypertension 02/01/2020  . Acute cystitis with hematuria 02/01/2020  . Postoperative examination 10/14/2017  . Calculus of gallbladder with chronic cholecystitis without obstruction 10/14/2017   PCP:  Marianne Sofia, PA-C Pharmacy:   [facility], [place] - [address] [place] [address] Phone: [phone] Fax: [phone]  [facility] [place], [place] - [address] [place] [address] Phone: [phone] Fax: [phone]     Social Determinants of Health (SDOH) Interventions    Readmission Risk Interventions No flowsheet data found.

## 2020-06-29 NOTE — Progress Notes (Signed)
Occupational Therapy Treatment Patient Details Name: Alexandra [name] MRN: [id number] DOB: 1956/01/28 Today's Date: 06/29/2020    History of present illness 64 y.o. female underwent cervical three-four Anterior cervical decompression/discectomy/fusion (N/A) with 6 mm small lordotic  PEEK cage with autograft and anterior cervical plate 62/9.   OT comments  Patient continues with numbness and tingling to both hands, but is completing all her self care and mobility at a Mod I level.  She is preparing to return home this afternoon.  She will have adequate supports at home.  OT issued her a reacher and dressing stick.  No further acute or post acute OT needs.    Follow Up Recommendations    None   Equipment Recommendations    None   Recommendations for Other Services  None    Precautions / Restrictions Precautions Precautions: Cervical;Fall Precaution Booklet Issued: Yes (comment) Required Braces or Orthoses: Cervical Brace Cervical Brace: Soft collar;For comfort Restrictions Weight Bearing Restrictions: No       Mobility Bed Mobility Overal bed mobility: Modified Independent                Transfers Overall transfer level: Modified independent Equipment used: Rolling walker (2 wheeled)                  Balance Overall balance assessment: No apparent balance deficits (not formally assessed)                                         ADL either performed or assessed with clinical judgement   ADL Overall ADL's : Modified independent                                       General ADL Comments: Able to bathe/dress and complete grooming without assist.     Vision Baseline Vision/History: Wears glasses Wears Glasses: At all times Patient Visual Report: No change from baseline     Perception     Praxis      Cognition Arousal/Alertness: Awake/alert Behavior During Therapy: WFL for tasks assessed/performed Overall Cognitive  Status: Within Functional Limits for tasks assessed                                                            Pertinent Vitals/ Pain       Faces Pain Scale: Hurts a little bit Pain Location: Neck Pain Descriptors / Indicators: Aching Pain Intervention(s): Monitored during session  Home Living Family/patient expects to be discharged to:: Private residence Living Arrangements: Other relatives Available Help at Discharge: Family;Available PRN/intermittently Type of Home: House Home Access: Stairs to enter [employer] of Steps: 3 Entrance Stairs-Rails: None Home Layout: One level     Bathroom Shower/Tub: Tub/shower unit         Home Equipment: Environmental consultant - 2 wheels;Shower seat   Additional Comments: Will stay with sister until she believes she can return home      Prior Functioning/Environment Level of Independence: Independent with assistive device(s)        Comments: RW and increased assist via sister.   Frequency  Progress Toward Goals  OT Goals(current goals can now be found in the care plan section)     Acute Rehab OT Goals Patient Stated Goal: move better and return to work OT Goal Formulation: With patient Time For Goal Achievement: 08/07/20  Plan      Co-evaluation                 AM-PAC OT "6 Clicks" Daily Activity     Outcome Measure   Help from another person eating meals?: None Help from another person taking care of personal grooming?: None Help from another person toileting, which includes using toliet, bedpan, or urinal?: None Help from another person bathing (including washing, rinsing, drying)?: None Help from another person to put on and taking off regular upper body clothing?: None Help from another person to put on and taking off regular lower body clothing?: None 6 Click Score: 24    End of Session Equipment Utilized During Treatment: Cervical collar  OT Visit Diagnosis:  Unsteadiness on feet (R26.81)   Activity Tolerance Patient tolerated treatment well   Patient Left in bed   Nurse Communication          Time: 6060-0459 OT Time Calculation (min): 22 min  Charges: OT Treatments $Self Care/Home Management : 8-22 mins  06/29/2020  Rich, OTR/L  Acute Rehabilitation Services  Office:  [phone]    Suzanna Obey 06/29/2020, 9:46 AM

## 2020-06-29 NOTE — Discharge Summary (Signed)
Physician Discharge Summary  Patient ID: Alexandra [name] MRN: [id number] DOB/AGE: 1956/01/03 64 y.o.  Admit date: 06/27/2020 Discharge date: 06/29/2020  Admission Diagnoses: Cervical Myelopathy with herniated cervical disc at C 34 with cord compression and cord signal change. Severe spinal stenosis with AP diameter of spinal canal of 3.6 mm    Discharge Diagnoses: Cervical Myelopathy with herniated cervical disc at C 34 with cord compression and cord signal change. Severe spinal stenosis with AP diameter of spinal canal of 3.6 mm s/p Cervical three-four Anterior cervical decompression/discectomy/fusion (N/A) with 6 mm small lordotic PEEK cage with autograft and anterior cervical plate    Active Problems:   Cervical myelopathy [facility])   Discharged Condition: good  Hospital Course: Alexandra [name] was admitted for surgery with dx cervical myelopathy with HNP and cord signal change. Following uncomplicated ACDF C3-4, she recovered well and transferred to [facility] for nursing care and therapies. She is progressing nicely.  Consults: None  Significant Diagnostic Studies: radiology: X-Ray: intra-op  Treatments: surgery: Cervical three-four Anterior cervical decompression/discectomy/fusion (N/A) with 6 mm small lordotic PEEK cage with autograft and anterior cervical plate    Discharge Exam: Blood pressure (!) 144/74, pulse 98, temperature 98.1 F (36.7 C), temperature source Oral, resp. rate 18, height 5\' 5"  (1.651 m), weight 125.2 kg, SpO2 95 %. Alert, conversant sitting in chair. Denies pain at present. Good strength bilat deltoids, biceps and triceps, with improved but not full hand intrinsics. Ambulating with rolling walker, improved. Incision without erythema, swelling or drainage beneath honeycomb and Dermabond. Soft collar in use.   Disposition: Discharge to home with HHPT per DrStern. Office f/u in 3 weeks. Pt verbalizes understanding of d/c instructions.Norco 5/325 and  Robaxin 500mg  will be eRx'ed for prn home use.    Allergies as of 06/29/2020   No Known Allergies   Discharge Instructions     Remove dressing in 72 hours   Complete by: As directed    Diet - low sodium heart healthy   Complete by: As directed    Increase activity slowly   Complete by: As directed      Allergies as of 06/29/2020   No Known Allergies     Medication List    TAKE these medications   ascorbic acid 500 MG tablet Commonly known as: VITAMIN C Take 500 mg by mouth daily.   citalopram 20 MG tablet Commonly known as: CELEXA TAKE 1 TABLET BY MOUTH DAILY   folic acid 1 MG tablet Commonly known as: FOLVITE Take 1 mg by mouth daily.   HYDROcodone-acetaminophen 5-325 MG tablet Commonly known as: NORCO/VICODIN Take 2 tablets by mouth every 4 (four) hours as needed for severe pain ((score 7 to 10)).   metFORMIN 1000 MG tablet Commonly known as: GLUCOPHAGE Take 1 tablet (1,000 mg total) by mouth 2 (two) times daily with a meal.   methocarbamol 500 MG tablet Commonly known as: ROBAXIN Take 1 tablet (500 mg total) by mouth every 6 (six) hours as needed for muscle spasms.   methotrexate 2.5 MG tablet Commonly known as: RHEUMATREX Take 25 mg by mouth every Sunday. (10 weekly)   montelukast 10 MG tablet Commonly known as: SINGULAIR TAKE 1 TABLET BY MOUTH DAILY   predniSONE 5 MG tablet Commonly known as: DELTASONE Take 5 mg by mouth daily as needed (arthritis).   ramipril 5 MG capsule Commonly known as: ALTACE TAKE 1 CAPSULE BY MOUTH DAILY   rosuvastatin 5 MG tablet Commonly known as: CRESTOR TAKE 1 TABLET BY MOUTH DAILY  Rybelsus 7 MG Tabs Generic drug: Semaglutide Take 7 mg by mouth daily.       Follow-up Information    [facility] Billing Dept. Call.   Why: For assistance with a payment plan on your hospital stay.  Contact information: [phone]       [facility] Dept. of Social Services. Call.   Why: To request info on  financial assistance.  Contact information: [phone]       [facility] Follow up.   Specialty: Home Health Services Why: Home Health PT arranged. They will contact you about 48 hours after discharge to arrange the first visit.  Contact information: [address] [place] [address] [phone]               Signed: [name], MD 06/29/2020, 11:28 AM

## 2020-06-29 NOTE — Progress Notes (Signed)
Inpatient Diabetes Program Recommendations  AACE/ADA: New Consensus Statement on Inpatient Glycemic Control (2015)  Target Ranges:  Prepandial:   less than 140 mg/dL      Peak postprandial:   less than 180 mg/dL (1-2 hours)      Critically ill patients:  140 - 180 mg/dL   Lab Results  Component Value Date   GLUCAP 224 (H) 06/29/2020   HGBA1C 9.5 (H) 05/23/2020    Review of Glycemic Control Results for Alexandra [name], Alexandra [name] (MRN [id number]) as of 06/29/2020 10:15  Ref. Range 06/28/2020 06:47 06/28/2020 11:21 06/28/2020 16:14 06/28/2020 21:28 06/29/2020 06:12  Glucose-Capillary Latest Ref Range: 70 - 99 mg/dL 676 (H) 195 (H) 093 (H) 385 (H) 224 (H)   Diabetes history:  DM2 Outpatient Diabetes medications:  Metformin 1000 mg bid Rybelsus 7 mg daily Current orders for Inpatient glycemic control:  Novolog 0-20 units tid & 0-5 qhs,  Metformin 1000 mg bid Semaglutide 7 mg daily Prednisone 5 mg daily PRN  Inpatient Diabetes Program Recommendations:     Blood sugars remain significantly elevated please consider,  Novolog 4 units tid with meals.  Needs close follow up with PCP as she may benefit from insulin.   Will continue to follow while inpatient.  Thank you, Dulce Sellar, RN, BSN Diabetes Coordinator Inpatient Diabetes Program [phone] (team pager from 8a-5p)

## 2020-06-29 NOTE — Progress Notes (Addendum)
Subjective: Patient reports "I'm doing ok I think. Still have the numbness in my hands, but I think I'm stronger"  Objective: Vital signs in last 24 hours: Temp:  [97.8 F (36.6 C)-98.4 F (36.9 C)] 98.1 F (36.7 C) (11/11 0754) Pulse Rate:  [96-107] 98 (11/11 0754) Resp:  [16-20] 18 (11/11 0754) BP: (137-144)/(62-87) 144/74 (11/11 0754) SpO2:  [95 %-99 %] 95 % (11/11 0754)  Intake/Output from previous day: No intake/output data recorded. Intake/Output this shift: No intake/output data recorded.  Alert, conversant sitting in chair. Denies pain at present. Good strength bilat deltoids, biceps and triceps, with improved but not full hand intrinsics. Ambulating with rolling walker, improved. Incision without erythema, swelling or drainage beneath honeycomb and Dermabond. Soft collar in use. Lab Results: Recent Labs    06/27/20 1429  WBC 7.2  HGB 14.8  HCT 45.0  PLT 308   BMET Recent Labs    06/27/20 1429  NA 137  K 4.8  CL 102  CO2 24  GLUCOSE 276*  BUN 14  CREATININE 0.77  CALCIUM 9.8    Studies/Results: DG Cervical Spine 2-3 Views  Result Date: 06/27/2020 CLINICAL DATA:  C3-C4 anterior fusion EXAM: CERVICAL SPINE - 2-3 VIEW COMPARISON:  June 26, 2020 FINDINGS: An initial cross-table lateral cervical image time stamped 4:00:52 p.m. shows a metallic probe tip overlying the C3-4 interspace from an anterior approach. No fracture or spondylolisthesis is evident in the visualized cervical region. The lower cervical spine is obscured by shoulder artifact. A second lateral lumbar image time stamped 4:31:05 submitted. There is anterior screw and plate fixation at C3 and C4 with a disc spacer at C3-4. No fracture or spondylolisthesis in areas visualized. Lower cervical spine not visualized due to shoulder artifact. IMPRESSION: Anterior screw and plate fixation at C3 and C4 with disc spacer at C3-4. Support hardware intact. No fracture or spondylolisthesis through the C4 level.  Inferior to C4, shoulder artifact precludes visualization. Electronically Signed   By: Bretta Bang III M.D.   On: 06/27/2020 18:21    Assessment/Plan: Improving  LOS: 2 days  Ok to d/c to home with HHPT per DrStern. Office f/u in 3 weeks. Pt verbalizes understanding of d/c instructions.    Georgiann Cocker 06/29/2020, 10:58 AM   Patient is doing well.  Discharge home.

## 2020-06-29 NOTE — Progress Notes (Signed)
Physical Therapy Treatment Patient Details Name: Alexandra [name] MRN: [id number] DOB: [birth year] Today's Date: 06/29/2020    History of Present Illness 64 y.o. female underwent cervical three-four Anterior cervical decompression/discectomy/fusion (N/A) with 6 mm small lordotic  PEEK cage with autograft and anterior cervical plate 57/3.    PT Comments    Pt progressing well with mobility. She required supervision transfers, supervision ambulation 200' with RW, and min assist ascend/descend 3 steps. Pt in recliner at end of session.   Follow Up Recommendations  Home health PT;Supervision - Intermittent     Equipment Recommendations  None recommended by PT (Pt has all needed DME.)    Recommendations for Other Services       Precautions / Restrictions Precautions Precautions: Cervical;Fall Precaution Booklet Issued: Yes (comment) Required Braces or Orthoses: Cervical Brace Cervical Brace: Soft collar;For comfort Restrictions Weight Bearing Restrictions: No    Mobility  Bed Mobility Overal bed mobility: Modified Independent             General bed mobility comments: +rail, increased time  Transfers Overall transfer level: Needs assistance Equipment used: Rolling walker (2 wheeled) Transfers: Sit to/from Stand Sit to Stand: Supervision         General transfer comment: Supervision for safety, cues for hand placement  Ambulation/Gait Ambulation/Gait assistance: Supervision Gait Distance (Feet): 200 Feet Assistive device: Rolling walker (2 wheeled) Gait Pattern/deviations: Step-through pattern;Decreased stride length Gait velocity: decreased Gait velocity interpretation: <1.31 ft/sec, indicative of household ambulator General Gait Details: steady gait with RW   Stairs Stairs: Yes Stairs assistance: Min assist Stair Management: One rail Left;Forwards;Step to pattern Number of Stairs: 3 General stair comments: Pt will have 2-person HHA to enter home. No rails but  steps are long enough for 3 people side by side.   Wheelchair Mobility    Modified Rankin (Stroke Patients Only)       Balance Overall balance assessment: Mild deficits observed, not formally tested                                          Cognition Arousal/Alertness: Awake/alert Behavior During Therapy: WFL for tasks assessed/performed Overall Cognitive Status: Within Functional Limits for tasks assessed                                        Exercises      General Comments General comments (skin integrity, edema, etc.): VSS on RA      Pertinent Vitals/Pain Pain Assessment: Faces Faces Pain Scale: Hurts little more Pain Location: Neck Pain Descriptors / Indicators: Grimacing;Discomfort Pain Intervention(s): Monitored during session;Repositioned    Home Living Family/patient expects to be discharged to:: Private residence Living Arrangements: Other relatives Available Help at Discharge: Family;Available PRN/intermittently Type of Home: House Home Access: Stairs to enter Entrance Stairs-Rails: None Home Layout: One level Home Equipment: Environmental consultant - 2 wheels;Shower seat Additional Comments: Will stay with sister until she believes she can return home    Prior Function Level of Independence: Independent with assistive device(s)      Comments: RW and increased assist via sister.   PT Goals (current goals can now be found in the care plan section) Acute Rehab PT Goals Patient Stated Goal: move better and return to work Progress towards PT goals: Progressing toward goals    Frequency  Min 5X/week      PT Plan Equipment recommendations need to be updated    Co-evaluation              AM-PAC PT "6 Clicks" Mobility   Outcome Measure  Help needed turning from your back to your side while in a flat bed without using bedrails?: None Help needed moving from lying on your back to sitting on the side of a flat bed without  using bedrails?: None Help needed moving to and from a bed to a chair (including a wheelchair)?: A Little Help needed standing up from a chair using your arms (e.g., wheelchair or bedside chair)?: A Little Help needed to walk in hospital room?: A Little Help needed climbing 3-5 steps with a railing? : A Little 6 Click Score: 20    End of Session Equipment Utilized During Treatment: Gait belt;Cervical collar Activity Tolerance: Patient tolerated treatment well Patient left: in chair;with call bell/phone within reach Nurse Communication: Mobility status PT Visit Diagnosis: Other abnormalities of gait and mobility (R26.89);Muscle weakness (generalized) (M62.81)     Time: 0802-0829 PT Time Calculation (min) (ACUTE ONLY): 27 min  Charges:  $Gait Training: 23-37 mins                     Aida Raider, [place]  Office # [phone] Pager [phone]    Ilda Foil 06/29/2020, 10:22 AM

## 2020-07-03 ENCOUNTER — Other Ambulatory Visit: Payer: Self-pay | Admitting: Neurosurgery

## 2020-07-10 ENCOUNTER — Other Ambulatory Visit: Payer: 59

## 2020-07-27 ENCOUNTER — Telehealth: Payer: Self-pay

## 2020-07-27 NOTE — Telephone Encounter (Signed)
Yes can get booster

## 2020-07-27 NOTE — Telephone Encounter (Signed)
Pt wants to know if it's ok if she gets her booster. She had surgery on her shoulder 4 weeks ago,sts she isn't on anymore medication from her surgery.

## 2020-08-06 ENCOUNTER — Encounter: Payer: Self-pay | Admitting: Physician Assistant

## 2020-08-22 ENCOUNTER — Other Ambulatory Visit: Payer: Self-pay | Admitting: Physician Assistant

## 2020-08-22 DIAGNOSIS — E119 Type 2 diabetes mellitus without complications: Secondary | ICD-10-CM

## 2020-08-24 ENCOUNTER — Encounter: Payer: Self-pay | Admitting: Physician Assistant

## 2020-08-24 ENCOUNTER — Ambulatory Visit (INDEPENDENT_AMBULATORY_CARE_PROVIDER_SITE_OTHER): Payer: 59 | Admitting: Physician Assistant

## 2020-08-24 ENCOUNTER — Other Ambulatory Visit: Payer: Self-pay

## 2020-08-24 VITALS — BP 130/78 | HR 97 | Temp 97.5°F | Ht 65.0 in | Wt 258.8 lb

## 2020-08-24 DIAGNOSIS — I10 Essential (primary) hypertension: Secondary | ICD-10-CM

## 2020-08-24 DIAGNOSIS — R634 Abnormal weight loss: Secondary | ICD-10-CM

## 2020-08-24 DIAGNOSIS — E119 Type 2 diabetes mellitus without complications: Secondary | ICD-10-CM

## 2020-08-24 DIAGNOSIS — M069 Rheumatoid arthritis, unspecified: Secondary | ICD-10-CM

## 2020-08-24 DIAGNOSIS — G959 Disease of spinal cord, unspecified: Secondary | ICD-10-CM

## 2020-08-24 DIAGNOSIS — Z1231 Encounter for screening mammogram for malignant neoplasm of breast: Secondary | ICD-10-CM

## 2020-08-24 DIAGNOSIS — E782 Mixed hyperlipidemia: Secondary | ICD-10-CM

## 2020-08-24 NOTE — Progress Notes (Signed)
Established Patient Office Visit  Subjective:  Patient ID: Alexandra [name], female    DOB: October 05, 1955  Age: 65 y.o. MRN: [id number]  CC:  Chief Complaint  Patient presents with  . Diabetes    HPI Ricka Westra presents for follow up diabetes Pt states that she has not been checking her glucose at all - says she is taking her medications of rybelsus 7mg  qd and glucophage 1000mg  bid She is overdue for eye appt  Pt with history of hyperlipidemia - pt states she is taking crestor 5mg  qd - not watching diet but states over the past few months she has not had an appetite and just not eating - no change in bms/no nausea or vomiting  Pt with history of RA - she is due to see specialist who has her on methotrexate and prednisone - she is going in [place] but would like to change to a provider in [facility] - would be more convenient for her  Pt on Celexa 20mg  qd for anxiety - states she has had some breakthrough symptoms but thinks more due to fact she had recent cspine surgery and lost her job - is recovering now and looking for new job  Pt due for mammogram  Past Medical History:  Diagnosis Date  . Gastro-esophageal reflux disease without esophagitis   . Major depressive disorder, single episode, mild ([place])   . Mixed hyperlipidemia   . Other cholangitis   . Other fatigue   . Other specified rheumatoid arthritis, multiple sites ([place])   . Perioral dermatitis   . Type 2 diabetes mellitus with other specified complication [facility])     Past Surgical History:  Procedure Laterality Date  . ANTERIOR CERVICAL DECOMP/DISCECTOMY FUSION N/A 06/27/2020   Procedure: Cervical three-four Anterior cervical decompression/discectomy/fusion;  Surgeon: Erline Levine, MD;  Location: [place];  Service: Neurosurgery;  Laterality: N/A;  . CERVICAL SPINE SURGERY  06/2020  . CHOLECYSTECTOMY    . ERCP      Family History  Problem Relation Age of Onset  . Emphysema Mother   . Cancer Father   . Neuropathy Neg Hx      Social History   Socioeconomic History  . Marital status: Single    Spouse name: Not on file  . Number of children: Not on file  . Years of education: Not on file  . Highest education level: High school graduate  Occupational History  . Not on file  Tobacco Use  . Smoking status: Never Smoker  . Smokeless tobacco: Never Used  Vaping Use  . Vaping Use: Never used  Substance and Sexual Activity  . Alcohol use: Never  . Drug use: Never  . Sexual activity: Not on file  Other Topics Concern  . Not on file  Social History Narrative   Lives alone   Right handed   Caffeine: never   Social Determinants of Health   Financial Resource Strain: Not on file  Food Insecurity: Not on file  Transportation Needs: Not on file  Physical Activity: Not on file  Stress: Not on file  Social Connections: Not on file  Intimate Partner Violence: Not on file     Current Outpatient Medications:  .  ascorbic acid (VITAMIN C) 500 MG tablet, Take 500 mg by mouth daily., Disp: , Rfl:  .  citalopram (CELEXA) 20 MG tablet, TAKE 1 TABLET BY MOUTH DAILY (Patient taking differently: Take 20 mg by mouth daily.), Disp: 90 tablet, Rfl: 0 .  folic acid (FOLVITE)  1 MG tablet, Take 1 mg by mouth daily., Disp: , Rfl:  .  metFORMIN (GLUCOPHAGE) 1000 MG tablet, TAKE 1 TABLET BY MOUTH TWICE DAILY WITH A MEAL, Disp: 180 tablet, Rfl: 0 .  methotrexate (RHEUMATREX) 2.5 MG tablet, Take 25 mg by mouth every Sunday. (10 weekly), Disp: , Rfl:  .  montelukast (SINGULAIR) 10 MG tablet, TAKE 1 TABLET BY MOUTH DAILY (Patient taking differently: Take 10 mg by mouth daily.), Disp: 90 tablet, Rfl: 3 .  predniSONE (DELTASONE) 5 MG tablet, Take 5 mg by mouth daily as needed (arthritis). , Disp: , Rfl:  .  ramipril (ALTACE) 5 MG capsule, TAKE 1 CAPSULE BY MOUTH DAILY (Patient taking differently: Take 5 mg by mouth daily.), Disp: 90 capsule, Rfl: 0 .  rosuvastatin (CRESTOR) 5 MG tablet, TAKE 1 TABLET BY MOUTH DAILY (Patient  taking differently: Take 5 mg by mouth daily.), Disp: 90 tablet, Rfl: 0 .  Semaglutide (RYBELSUS) 7 MG TABS, Take 7 mg by mouth daily., Disp: 30 tablet, Rfl: 2   No Known Allergies  ROS CONSTITUTIONAL: see HPI E/N/T: Negative for ear pain, nasal congestion and sore throat.  CARDIOVASCULAR: Negative for chest pain, dizziness, palpitations and pedal edema.  RESPIRATORY: Negative for recent cough and dyspnea.  GASTROINTESTINAL: Negative for abdominal pain, acid reflux symptoms, constipation, diarrhea, nausea and vomiting. Has had about a 20 pound weight loss in the past 3 months MSK: Negative for arthralgias and myalgias.  INTEGUMENTARY: Negative for rash.  NEUROLOGICAL: Negative for dizziness and headaches.  PSYCHIATRIC: Negative for sleep disturbance and to question depression screen.  Negative for depression, negative for anhedonia.        Objective:    PHYSICAL EXAM:   VS: BP 130/78 (BP Location: Right Arm, Patient Position: Sitting, Cuff Size: Large)   Pulse 97   Temp (!) 97.5 F (36.4 C) (Temporal)   Ht 5\' 5"  (1.651 m)   Wt 258 lb 12.8 oz (117.4 kg)   SpO2 95%   BMI 43.07 kg/m   GEN: Well nourished, well developed, in no acute distress  Cardiac: RRR; no murmurs, rubs, or gallops,no edema - no significant varicosities Respiratory:  normal respiratory rate and pattern with no distress - normal breath sounds with no rales, rhonchi, wheezes or rubs Skin: warm and dry, no rash  Neuro:  Alert and Oriented x 3, Strength and sensation are intact - CN II-Xii grossly intact Psych: euthymic mood, appropriate affect and demeanor  BP 130/78 (BP Location: Right Arm, Patient Position: Sitting, Cuff Size: Large)   Pulse 97   Temp (!) 97.5 F (36.4 C) (Temporal)   Ht 5\' 5"  (1.651 m)   Wt 258 lb 12.8 oz (117.4 kg)   SpO2 95%   BMI 43.07 kg/m  Wt Readings from Last 3 Encounters:  08/24/20 258 lb 12.8 oz (117.4 kg)  06/27/20 276 lb (125.2 kg)  06/08/20 276 lb (125.2 kg)      Health Maintenance Due  Topic Date Due  . OPHTHALMOLOGY EXAM  Never done  . MAMMOGRAM  09/24/2019    There are no preventive care reminders to display for this patient.  Lab Results  Component Value Date   TSH 2.030 05/23/2020   Lab Results  Component Value Date   WBC 7.2 06/27/2020   HGB 14.8 06/27/2020   HCT 45.0 06/27/2020   MCV 95.9 06/27/2020   PLT 308 06/27/2020   Lab Results  Component Value Date   NA 137 06/27/2020   K 4.8 06/27/2020  CO2 24 06/27/2020   GLUCOSE 276 (H) 06/27/2020   BUN 14 06/27/2020   CREATININE 0.77 06/27/2020   BILITOT 0.5 05/23/2020   ALKPHOS 92 05/23/2020   AST 29 05/23/2020   ALT 31 05/23/2020   PROT 7.6 05/23/2020   ALBUMIN 4.4 05/23/2020   CALCIUM 9.8 06/27/2020   ANIONGAP 11 06/27/2020   Lab Results  Component Value Date   CHOL 177 05/23/2020   Lab Results  Component Value Date   HDL 35 (L) 05/23/2020   Lab Results  Component Value Date   LDLCALC 102 (H) 05/23/2020   Lab Results  Component Value Date   TRIG 232 (H) 05/23/2020   Lab Results  Component Value Date   CHOLHDL 5.1 (H) 05/23/2020   Lab Results  Component Value Date   HGBA1C 9.5 (H) 05/23/2020      Assessment & Plan:   Problem List Items Addressed This Visit      Cardiovascular and Mediastinum   Essential hypertension - Primary   Relevant Orders   CBC with Differential/Platelet   Comprehensive metabolic panel   TSH     Endocrine   Diabetes mellitus type II, non insulin dependent (HCC)   Relevant Orders   Hemoglobin A1c     Nervous and Auditory   Cervical myelopathy (HCC) Continue post op follow up with specialist     Other   Mixed hyperlipidemia   Relevant Orders   Lipid panel Watch diet   Weight loss   Relevant Orders   CBC with Differential/Platelet   Comprehensive metabolic panel   TSH Weight recheck in one month - at that time if still an issue pt will agree to referral to GI - she defers at this time despite fact she  is also due for colonoscopy    Other Visit Diagnoses    Rheumatoid arthritis involving multiple sites, unspecified whether rheumatoid factor present [facility])       Relevant Orders   Ambulatory referral to Rheumatology   Encounter for screening mammogram for breast cancer       Relevant Orders   MM Digital Screening      No orders of the defined types were placed in this encounter.   Follow-up: Return for 1 month nurse visit weight check and 3 month fasting follow up.    SARA R [place], PA-C

## 2020-08-25 ENCOUNTER — Other Ambulatory Visit: Payer: Self-pay | Admitting: Physician Assistant

## 2020-08-25 LAB — CARDIOVASCULAR RISK ASSESSMENT

## 2020-08-25 LAB — LIPID PANEL
Chol/HDL Ratio: 4.2 ratio (ref 0.0–4.4)
Cholesterol, Total: 134 mg/dL (ref 100–199)
HDL: 32 mg/dL — ABNORMAL LOW (ref >39)
LDL Chol Calc (NIH): 65 mg/dL (ref 0–99)
Triglycerides: 228 mg/dL — ABNORMAL HIGH (ref 0–149)
VLDL Cholesterol Cal: 37 mg/dL (ref 5–40)

## 2020-08-25 LAB — CBC WITH DIFFERENTIAL/PLATELET
Basophils Absolute: 0.1 10*3/uL (ref 0.0–0.2)
Basos: 1 %
EOS (ABSOLUTE): 0.2 10*3/uL (ref 0.0–0.4)
Eos: 2 %
Hematocrit: 43 % (ref 34.0–46.6)
Hemoglobin: 14.6 g/dL (ref 11.1–15.9)
Immature Grans (Abs): 0 10*3/uL (ref 0.0–0.1)
Immature Granulocytes: 0 %
Lymphocytes Absolute: 1.9 10*3/uL (ref 0.7–3.1)
Lymphs: 21 %
MCH: 31.1 pg (ref 26.6–33.0)
MCHC: 34 g/dL (ref 31.5–35.7)
MCV: 92 fL (ref 79–97)
Monocytes Absolute: 0.4 10*3/uL (ref 0.1–0.9)
Monocytes: 4 %
Neutrophils Absolute: 6.3 10*3/uL (ref 1.4–7.0)
Neutrophils: 72 %
Platelets: 312 10*3/uL (ref 150–450)
RBC: 4.69 x10E6/uL (ref 3.77–5.28)
RDW: 12.6 % (ref 11.7–15.4)
WBC: 8.9 10*3/uL (ref 3.4–10.8)

## 2020-08-25 LAB — COMPREHENSIVE METABOLIC PANEL
ALT: 34 IU/L — ABNORMAL HIGH (ref 0–32)
AST: 24 IU/L (ref 0–40)
Albumin/Globulin Ratio: 1.3 (ref 1.2–2.2)
Albumin: 4 g/dL (ref 3.8–4.8)
Alkaline Phosphatase: 76 IU/L (ref 44–121)
BUN/Creatinine Ratio: 24 (ref 12–28)
BUN: 24 mg/dL (ref 8–27)
Bilirubin Total: 0.5 mg/dL (ref 0.0–1.2)
CO2: 21 mmol/L (ref 20–29)
Calcium: 10.1 mg/dL (ref 8.7–10.3)
Chloride: 100 mmol/L (ref 96–106)
Creatinine, Ser: 1.01 mg/dL — ABNORMAL HIGH (ref 0.57–1.00)
GFR calc Af Amer: 69 mL/min/{1.73_m2} (ref >59)
GFR calc non Af Amer: 60 mL/min/{1.73_m2} (ref >59)
Globulin, Total: 3 g/dL (ref 1.5–4.5)
Glucose: 206 mg/dL — ABNORMAL HIGH (ref 65–99)
Potassium: 4.6 mmol/L (ref 3.5–5.2)
Sodium: 138 mmol/L (ref 134–144)
Total Protein: 7 g/dL (ref 6.0–8.5)

## 2020-08-25 LAB — HEMOGLOBIN A1C
Est. average glucose Bld gHb Est-mCnc: 214 mg/dL
Hgb A1c MFr Bld: 9.1 % — ABNORMAL HIGH (ref 4.8–5.6)

## 2020-08-25 LAB — TSH: TSH: 3.37 u[IU]/mL (ref 0.450–4.500)

## 2020-08-25 MED ORDER — PIOGLITAZONE HCL 15 MG PO TABS: 15.0000 mg | ORAL_TABLET | Freq: Every day | ORAL | 2 refills | Status: DC

## 2020-09-27 ENCOUNTER — Ambulatory Visit: Payer: 59

## 2020-10-09 ENCOUNTER — Other Ambulatory Visit: Payer: Self-pay

## 2020-10-09 MED ORDER — RYBELSUS 7 MG PO TABS: 7.0000 mg | ORAL_TABLET | Freq: Every day | ORAL | 2 refills | Status: DC

## 2020-10-09 NOTE — Telephone Encounter (Signed)
Called pt. Pt Alexandra [name].

## 2020-10-09 NOTE — Telephone Encounter (Signed)
Pt called and states she is trying to get Rybellsus. States pharmacy told her they faxed a request and we stated she needed an appointment. CMA did not see this documented anywhere nor a request. Will send a request to provider for rybellsus to be sent to [facility]. Please advise.

## 2020-11-01 ENCOUNTER — Other Ambulatory Visit: Payer: Self-pay | Admitting: Physician Assistant

## 2020-11-01 DIAGNOSIS — E782 Mixed hyperlipidemia: Secondary | ICD-10-CM

## 2020-11-01 DIAGNOSIS — E119 Type 2 diabetes mellitus without complications: Secondary | ICD-10-CM

## 2020-11-24 ENCOUNTER — Other Ambulatory Visit: Payer: Self-pay

## 2020-11-24 DIAGNOSIS — M069 Rheumatoid arthritis, unspecified: Secondary | ICD-10-CM

## 2020-11-24 NOTE — Progress Notes (Signed)
Pt switched insurances and is requesting rheumatology referral be placed again in hopes she can see someone in [place] with new insurance.   Alexandra [name], [place] 11/24/20 10:24 AM

## 2020-11-27 ENCOUNTER — Telehealth: Payer: Self-pay

## 2020-11-27 NOTE — Telephone Encounter (Signed)
PA submitted and approved via covermymeds for Rybelsus. 

## 2020-11-28 ENCOUNTER — Telehealth: Payer: Self-pay

## 2020-11-28 NOTE — Telephone Encounter (Signed)
PA submitted and approved for Rybelsus 7mg  via [email or web address]

## 2020-11-29 ENCOUNTER — Ambulatory Visit: Payer: 59 | Admitting: Physician Assistant

## 2020-12-01 ENCOUNTER — Ambulatory Visit: Payer: 59 | Admitting: Physician Assistant

## 2020-12-06 ENCOUNTER — Ambulatory Visit (INDEPENDENT_AMBULATORY_CARE_PROVIDER_SITE_OTHER): Payer: 59 | Admitting: Physician Assistant

## 2020-12-06 ENCOUNTER — Encounter: Payer: Self-pay | Admitting: Physician Assistant

## 2020-12-06 ENCOUNTER — Other Ambulatory Visit: Payer: Self-pay

## 2020-12-06 VITALS — BP 124/78 | HR 86 | Temp 96.1°F | Ht 65.0 in | Wt 271.0 lb

## 2020-12-06 DIAGNOSIS — F419 Anxiety disorder, unspecified: Secondary | ICD-10-CM | POA: Diagnosis not present

## 2020-12-06 DIAGNOSIS — M069 Rheumatoid arthritis, unspecified: Secondary | ICD-10-CM | POA: Insufficient documentation

## 2020-12-06 DIAGNOSIS — E119 Type 2 diabetes mellitus without complications: Secondary | ICD-10-CM | POA: Diagnosis not present

## 2020-12-06 DIAGNOSIS — E782 Mixed hyperlipidemia: Secondary | ICD-10-CM | POA: Diagnosis not present

## 2020-12-06 MED ORDER — MOMETASONE FUROATE 0.1 % EX CREA: TOPICAL_CREAM | CUTANEOUS | 1 refills | Status: DC

## 2020-12-06 MED ORDER — PREDNISONE 5 MG PO TABS
5.0000 mg | ORAL_TABLET | Freq: Every day | ORAL | 1 refills | Status: DC | PRN
Start: 2020-12-06 — End: 2021-01-25

## 2020-12-06 NOTE — Progress Notes (Signed)
Established Patient Office Visit  Subjective:  Patient ID: Alexandra [name], female    DOB: 05-Jan-1956  Age: 65 y.o. MRN: [id number]  CC:  Chief Complaint  Patient presents with  . Diabetes  . Hyperlipidemia    HPI Alexandra [name] presents for follow up diabetes Pt states that she has not been checking her glucose at all - says she is taking her medications of rybelsus 7mg  qd and glucophage 1000mg  bid- did not start the actos States she will get a new meter and start checking her glucose She is overdue for eye appt - will schedule before her next follow up here  Pt with history of hyperlipidemia - pt states she is taking crestor 5mg  qd - she has not been doing low cholesterol diet  Pt with history of RA - pt will be seeing new provider on 01/17/21 - requests refill of prednisone and elocon cream until she can get in for appt  Pt on Celexa 20mg  qd for anxiety - pt states she is doing overall well on this medication    Past Medical History:  Diagnosis Date  . Gastro-esophageal reflux disease without esophagitis   . Major depressive disorder, single episode, mild (HCC)   . Mixed hyperlipidemia   . Other cholangitis   . Other fatigue   . Other specified rheumatoid arthritis, multiple sites (HCC)   . Perioral dermatitis   . Type 2 diabetes mellitus with other specified complication [facility])     Past Surgical History:  Procedure Laterality Date  . ANTERIOR CERVICAL DECOMP/DISCECTOMY FUSION N/A 06/27/2020   Procedure: Cervical three-four Anterior cervical decompression/discectomy/fusion;  Surgeon: Maeola Harman, MD;  Location: [facility] OR;  Service: Neurosurgery;  Laterality: N/A;  . CERVICAL SPINE SURGERY  06/2020  . CHOLECYSTECTOMY    . ERCP      Family History  Problem Relation Age of Onset  . Emphysema Mother   . Cancer Father   . Neuropathy Neg Hx     Social History   Socioeconomic History  . Marital status: Single    Spouse name: Not on file  . Number of children: Not on file  .  Years of education: Not on file  . Highest education level: High school graduate  Occupational History  . Not on file  Tobacco Use  . Smoking status: Never Smoker  . Smokeless tobacco: Never Used  Vaping Use  . Vaping Use: Never used  Substance and Sexual Activity  . Alcohol use: Never  . Drug use: Never  . Sexual activity: Not on file  Other Topics Concern  . Not on file  Social History Narrative   Lives alone   Right handed   Caffeine: never   Social Determinants of Health   Financial Resource Strain: Not on file  Food Insecurity: Not on file  Transportation Needs: Not on file  Physical Activity: Not on file  Stress: Not on file  Social Connections: Not on file  Intimate Partner Violence: Not on file     Current Outpatient Medications:  .  mometasone (ELOCON) 0.1 % cream, Apply bid to affected area, Disp: 15 g, Rfl: 1 .  ascorbic acid (VITAMIN C) 500 MG tablet, Take 500 mg by mouth daily., Disp: , Rfl:  .  citalopram (CELEXA) 20 MG tablet, TAKE 1 TABLET BY MOUTH DAILY, Disp: 90 tablet, Rfl: 0 .  folic acid (FOLVITE) 1 MG tablet, Take 1 mg by mouth daily., Disp: , Rfl:  .  metFORMIN (GLUCOPHAGE) 1000 MG tablet, TAKE  1 TABLET BY MOUTH TWICE DAILY WITH A MEAL, Disp: 180 tablet, Rfl: 0 .  montelukast (SINGULAIR) 10 MG tablet, TAKE 1 TABLET BY MOUTH DAILY (Patient taking differently: Take 10 mg by mouth daily.), Disp: 90 tablet, Rfl: 3 .  predniSONE (DELTASONE) 5 MG tablet, Take 1 tablet (5 mg total) by mouth daily as needed (arthritis)., Disp: 30 tablet, Rfl: 1 .  ramipril (ALTACE) 5 MG capsule, TAKE 1 CAPSULE BY MOUTH DAILY, Disp: 90 capsule, Rfl: 0 .  rosuvastatin (CRESTOR) 5 MG tablet, TAKE 1 TABLET BY MOUTH DAILY, Disp: 90 tablet, Rfl: 0 .  Semaglutide (RYBELSUS) 7 MG TABS, Take 7 mg by mouth daily., Disp: 30 tablet, Rfl: 2   No Known Allergies  ROS CONSTITUTIONAL: Negative for chills, fatigue, fever, unintentional weight gain and unintentional weight loss.  E/N/T:  Negative for ear pain, nasal congestion and sore throat.  CARDIOVASCULAR: Negative for chest pain, dizziness, palpitations and pedal edema.  RESPIRATORY: Negative for recent cough and dyspnea.  GASTROINTESTINAL: Negative for abdominal pain, acid reflux symptoms, constipation, diarrhea, nausea and vomiting.  MSK: Negative for arthralgias and myalgias.  INTEGUMENTARY: Negative for rash.  NEUROLOGICAL: Negative for dizziness and headaches.  PSYCHIATRIC: Negative for sleep disturbance and to question depression screen.  Negative for depression, negative for anhedonia.         Objective:    PHYSICAL EXAM:   VS: BP 124/78   Pulse 86   Temp (!) 96.1 F (35.6 C)   Ht 5\' 5"  (1.651 m)   Wt 271 lb (122.9 kg)   SpO2 100%   BMI 45.10 kg/m   PHYSICAL EXAM:   VS: BP 124/78   Pulse 86   Temp (!) 96.1 F (35.6 C)   Ht 5\' 5"  (1.651 m)   Wt 271 lb (122.9 kg)   SpO2 100%   BMI 45.10 kg/m   GEN: Well nourished, well developed, in no acute distress  Cardiac: RRR; no murmurs, rubs, or gallops,no edema -  Respiratory:  normal respiratory rate and pattern with no distress - normal breath sounds with no rales, rhonchi, wheezes or rubs MS: no deformity or atrophy  Skin: warm and dry, no rash  Neuro:  Alert and Oriented x 3, Strength and sensation are intact - CN II-Xii grossly intact Psych: euthymic mood, appropriate affect and demeanor  Diabetic Foot Exam - Simple   Simple Foot Form Diabetic Foot exam was performed with the following findings: Yes 12/06/2020  8:41 AM  Visual Inspection No deformities, no ulcerations, no other skin breakdown bilaterally: Yes Sensation Testing Intact to touch and monofilament testing bilaterally: Yes Pulse Check Posterior Tibialis and Dorsalis pulse intact bilaterally: Yes Comments      BP 124/78   Pulse 86   Temp (!) 96.1 F (35.6 C)   Ht 5\' 5"  (1.651 m)   Wt 271 lb (122.9 kg)   SpO2 100%   BMI 45.10 kg/m  Wt Readings from Last 3 Encounters:   12/06/20 271 lb (122.9 kg)  08/24/20 258 lb 12.8 oz (117.4 kg)  06/27/20 276 lb (125.2 kg)     Health Maintenance Due  Topic Date Due  . OPHTHALMOLOGY EXAM  Never done  . PAP SMEAR-Modifier  Never done  . MAMMOGRAM  09/24/2019    There are no preventive care reminders to display for this patient.  Lab Results  Component Value Date   TSH 3.370 08/24/2020   Lab Results  Component Value Date   WBC 8.9 08/24/2020   HGB  14.6 08/24/2020   HCT 43.0 08/24/2020   MCV 92 08/24/2020   PLT 312 08/24/2020   Lab Results  Component Value Date   NA 138 08/24/2020   K 4.6 08/24/2020   CO2 21 08/24/2020   GLUCOSE 206 (H) 08/24/2020   BUN 24 08/24/2020   CREATININE 1.01 (H) 08/24/2020   BILITOT 0.5 08/24/2020   ALKPHOS 76 08/24/2020   AST 24 08/24/2020   ALT 34 (H) 08/24/2020   PROT 7.0 08/24/2020   ALBUMIN 4.0 08/24/2020   CALCIUM 10.1 08/24/2020   ANIONGAP 11 06/27/2020   Lab Results  Component Value Date   CHOL 134 08/24/2020   Lab Results  Component Value Date   HDL 32 (L) 08/24/2020   Lab Results  Component Value Date   LDLCALC 65 08/24/2020   Lab Results  Component Value Date   TRIG 228 (H) 08/24/2020   Lab Results  Component Value Date   CHOLHDL 4.2 08/24/2020   Lab Results  Component Value Date   HGBA1C 9.1 (H) 08/24/2020      Assessment & Plan:   Problem List Items Addressed This Visit      Cardiovascular and Mediastinum   Essential hypertension - Primary   Relevant Orders   CBC with Differential/Platelet   Comprehensive metabolic panel   TSH     Endocrine   Diabetes mellitus type II, non insulin dependent (HCC)   Relevant Orders   Hemoglobin A1c Continue current meds and watch diet             Other   Mixed hyperlipidemia   Relevant Orders   Lipid panel Watch diet   Continue crestor 5mg  qd                Other Visit Diagnoses    Rheumatoid arthritis involving multiple sites, unspecified whether rheumatoid factor present  (HCC)       Relevant Orders   Refill of prednisone Keep appt with specialist               Meds ordered this encounter  Medications  . predniSONE (DELTASONE) 5 MG tablet    Sig: Take 1 tablet (5 mg total) by mouth daily as needed (arthritis).    Dispense:  30 tablet    Refill:  1    Order Specific Question:   Supervising Provider    Answer Blane Ohara  . mometasone (ELOCON) 0.1 % cream    Sig: Apply bid to affected area    Dispense:  15 g    Refill:  1    Order Specific Question:   Supervising Provider    [id number]    Follow-up: Return in about 4 months (around 04/07/2021) for chronic fasting follow up.    SARA R Satomi Buda, PA-C

## 2020-12-07 ENCOUNTER — Other Ambulatory Visit: Payer: Self-pay | Admitting: Physician Assistant

## 2020-12-07 DIAGNOSIS — E119 Type 2 diabetes mellitus without complications: Secondary | ICD-10-CM

## 2020-12-07 LAB — COMPREHENSIVE METABOLIC PANEL
ALT: 21 IU/L (ref 0–32)
AST: 15 IU/L (ref 0–40)
Albumin/Globulin Ratio: 1.2 (ref 1.2–2.2)
Albumin: 3.7 g/dL — ABNORMAL LOW (ref 3.8–4.8)
Alkaline Phosphatase: 81 IU/L (ref 44–121)
BUN/Creatinine Ratio: 23 (ref 12–28)
BUN: 20 mg/dL (ref 8–27)
Bilirubin Total: 0.4 mg/dL (ref 0.0–1.2)
CO2: 26 mmol/L (ref 20–29)
Calcium: 9.4 mg/dL (ref 8.7–10.3)
Chloride: 100 mmol/L (ref 96–106)
Creatinine, Ser: 0.88 mg/dL (ref 0.57–1.00)
Globulin, Total: 3 g/dL (ref 1.5–4.5)
Glucose: 216 mg/dL — ABNORMAL HIGH (ref 65–99)
Potassium: 4.6 mmol/L (ref 3.5–5.2)
Sodium: 138 mmol/L (ref 134–144)
Total Protein: 6.7 g/dL (ref 6.0–8.5)
eGFR: 74 mL/min/{1.73_m2} (ref >59)

## 2020-12-07 LAB — CARDIOVASCULAR RISK ASSESSMENT

## 2020-12-07 LAB — CBC WITH DIFFERENTIAL/PLATELET
Basophils Absolute: 0.1 10*3/uL (ref 0.0–0.2)
Basos: 1 %
EOS (ABSOLUTE): 0.2 10*3/uL (ref 0.0–0.4)
Eos: 2 %
Hematocrit: 40.6 % (ref 34.0–46.6)
Hemoglobin: 13.2 g/dL (ref 11.1–15.9)
Immature Grans (Abs): 0 10*3/uL (ref 0.0–0.1)
Immature Granulocytes: 0 %
Lymphocytes Absolute: 2.3 10*3/uL (ref 0.7–3.1)
Lymphs: 24 %
MCH: 30.6 pg (ref 26.6–33.0)
MCHC: 32.5 g/dL (ref 31.5–35.7)
MCV: 94 fL (ref 79–97)
Monocytes Absolute: 0.8 10*3/uL (ref 0.1–0.9)
Monocytes: 8 %
Neutrophils Absolute: 6.1 10*3/uL (ref 1.4–7.0)
Neutrophils: 65 %
Platelets: 275 10*3/uL (ref 150–450)
RBC: 4.32 x10E6/uL (ref 3.77–5.28)
RDW: 12.2 % (ref 11.7–15.4)
WBC: 9.5 10*3/uL (ref 3.4–10.8)

## 2020-12-07 LAB — LIPID PANEL
Chol/HDL Ratio: 4.5 ratio — ABNORMAL HIGH (ref 0.0–4.4)
Cholesterol, Total: 152 mg/dL (ref 100–199)
HDL: 34 mg/dL — ABNORMAL LOW (ref >39)
LDL Chol Calc (NIH): 87 mg/dL (ref 0–99)
Triglycerides: 181 mg/dL — ABNORMAL HIGH (ref 0–149)
VLDL Cholesterol Cal: 31 mg/dL (ref 5–40)

## 2020-12-07 LAB — HEMOGLOBIN A1C
Est. average glucose Bld gHb Est-mCnc: 220 mg/dL
Hgb A1c MFr Bld: 9.3 % — ABNORMAL HIGH (ref 4.8–5.6)

## 2020-12-07 MED ORDER — RYBELSUS 14 MG PO TABS
14.0000 mg | ORAL_TABLET | Freq: Every day | ORAL | 2 refills | Status: DC
Start: 2020-12-07 — End: 2021-04-10

## 2020-12-11 ENCOUNTER — Ambulatory Visit
Admission: RE | Admit: 2020-12-11 | Discharge: 2020-12-11 | Disposition: A | Discharge status: Home / Self Care | Payer: 59 | Source: Ambulatory Visit | Attending: Physician Assistant | Admitting: Physician Assistant

## 2020-12-11 ENCOUNTER — Other Ambulatory Visit: Payer: Self-pay

## 2021-01-25 ENCOUNTER — Other Ambulatory Visit: Payer: Self-pay | Admitting: Physician Assistant

## 2021-01-25 DIAGNOSIS — E119 Type 2 diabetes mellitus without complications: Secondary | ICD-10-CM

## 2021-01-25 DIAGNOSIS — E782 Mixed hyperlipidemia: Secondary | ICD-10-CM

## 2021-02-06 DIAGNOSIS — F339 Major depressive disorder, recurrent, unspecified: Secondary | ICD-10-CM | POA: Insufficient documentation

## 2021-02-28 ENCOUNTER — Other Ambulatory Visit: Payer: Self-pay | Admitting: Family Medicine

## 2021-03-27 ENCOUNTER — Other Ambulatory Visit: Payer: Self-pay | Admitting: Family Medicine

## 2021-03-27 DIAGNOSIS — E782 Mixed hyperlipidemia: Secondary | ICD-10-CM

## 2021-04-10 ENCOUNTER — Other Ambulatory Visit: Payer: Self-pay | Admitting: Physician Assistant

## 2021-04-10 ENCOUNTER — Ambulatory Visit: Payer: 59 | Admitting: Physician Assistant

## 2021-04-10 DIAGNOSIS — E119 Type 2 diabetes mellitus without complications: Secondary | ICD-10-CM

## 2021-04-17 ENCOUNTER — Other Ambulatory Visit: Payer: Self-pay

## 2021-04-17 ENCOUNTER — Ambulatory Visit: Payer: 59 | Admitting: Physician Assistant

## 2021-04-17 ENCOUNTER — Encounter: Payer: Self-pay | Admitting: Physician Assistant

## 2021-04-17 VITALS — BP 126/68 | HR 72 | Temp 95.1°F | Ht 65.0 in | Wt 263.0 lb

## 2021-04-17 DIAGNOSIS — I10 Essential (primary) hypertension: Secondary | ICD-10-CM | POA: Diagnosis not present

## 2021-04-17 DIAGNOSIS — Z23 Encounter for immunization: Secondary | ICD-10-CM

## 2021-04-17 DIAGNOSIS — E119 Type 2 diabetes mellitus without complications: Secondary | ICD-10-CM | POA: Diagnosis not present

## 2021-04-17 DIAGNOSIS — E782 Mixed hyperlipidemia: Secondary | ICD-10-CM

## 2021-04-17 NOTE — Progress Notes (Signed)
Established Patient Office Visit  Subjective:  Patient ID: Alexandra [name], female    DOB: 1956/02/04  Age: 65 y.o. MRN: [id number]  CC:  Chief Complaint  Patient presents with   Diabetes   Hypertension   Hyperlipidemia    HPI Alexandra [name] presents for follow up diabetes Pt states that she has not been checking her glucose at all - says she is taking her medications of rybelsus 51m qd and glucophage 10047mbid-  States she will get a new meter and start checking her glucose She is overdue for eye appt -   Pt with history of hyperlipidemia - pt states she is taking crestor 32m19md - she has not been doing low cholesterol diet  Pt with history of RA - pt has seen her new provider and restarted methotrexate and uses prednisone as needed  Pt on Celexa 75m53m for anxiety - pt states she is doing overall well on this medication    Past Medical History:  Diagnosis Date   Gastro-esophageal reflux disease without esophagitis    Major depressive disorder, single episode, mild (HCC)    Mixed hyperlipidemia    Other cholangitis    Other fatigue    Other specified rheumatoid arthritis, multiple sites (HCC)[place] Perioral dermatitis    [facility]  Past [facility] History:  Procedure Laterality Date   ANTERIOR CERVICAL DECOMP/DISCECTOMY FUSION N/A 06/27/2020   Procedure: Cervical three-four Anterior cervical decompression/discectomy/fusion;  Surgeon: SterErline Levine;  [facility];  Laterality: N/A;   CERVICAL SPINE SURGERY  06/2020   CHOLECYSTECTOMY     ERCP      Family History  Problem Relation Age of Onset   Emphysema Mother    Cancer Father    Neuropathy Neg Hx     Social History   Socioeconomic History   Marital status: Single    Spouse name: Not on file   Number of children: Not on file   Years of education: Not on file   Highest education level: High school graduate  Occupational History    Not on file  Tobacco Use   Smoking status: Never   Smokeless tobacco: Never  Vaping Use   Vaping Use: Never used  Substance and Sexual Activity   Alcohol use: Never   Drug use: Never   Sexual activity: Not on file  Other Topics Concern   Not on file  Social History Narrative   Lives alone   Right handed   Caffeine: never   Social Determinants of Health   Financial Resource Strain: Not on file  Food Insecurity: Not on file  Transportation Needs: Not on file  Physical Activity: Not on file  Stress: Not on file  Social Connections: Not on file  Intimate Partner Violence: Not on file     Current Outpatient Medications:    ascorbic acid (VITAMIN C) 500 MG tablet, Take 500 mg by mouth daily., Disp: , Rfl:    citalopram (CELEXA) 20 MG tablet, TAKE 1 TABLET BY MOUTH DAILY, Disp: 90 tablet, Rfl: 0   folic acid (FOLVITE) 1 MG tablet, Take 1 mg by mouth daily., Disp: , Rfl:    metFORMIN (GLUCOPHAGE) 1000 MG tablet, TAKE 1 TABLET BY MOUTH TWICE DAILY WITH A MEAL, Disp: 180 tablet, Rfl: 0   methotrexate (RHEUMATREX) 2.5 MG tablet, Take 2.5 mg by mouth once a week. Take 10 tablets by mouth weekly, Disp: ,  Rfl:    mometasone (ELOCON) 0.1 % cream, Apply bid to affected area, Disp: 15 g, Rfl: 1   montelukast (SINGULAIR) 10 MG tablet, TAKE 1 TABLET BY MOUTH DAILY (Patient taking differently: Take 10 mg by mouth daily.), Disp: 90 tablet, Rfl: 3   predniSONE (DELTASONE) 5 MG tablet, TAKE 1 TABLET BY MOUTH DAILY AS NEEDED, Disp: 30 tablet, Rfl: 1   ramipril (ALTACE) 5 MG capsule, TAKE 1 CAPSULE BY MOUTH DAILY, Disp: 90 capsule, Rfl: 0   rosuvastatin (CRESTOR) 5 MG tablet, TAKE 1 TABLET BY MOUTH DAILY, Disp: 90 tablet, Rfl: 0   RYBELSUS 14 MG TABS, TAKE 1 TABLET BY MOUTH DAILY, Disp: 30 tablet, Rfl: 2   No Known Allergies  CONSTITUTIONAL: Negative for chills, fatigue, fever, unintentional weight gain and unintentional weight loss.  E/N/T: Negative for ear pain, nasal congestion and sore  throat.  CARDIOVASCULAR: Negative for chest pain, dizziness, palpitations and pedal edema.  RESPIRATORY: Negative for recent cough and dyspnea.  GASTROINTESTINAL: Negative for abdominal pain, acid reflux symptoms, constipation, diarrhea, nausea and vomiting.  MSK: Negative for arthralgias and myalgias.  INTEGUMENTARY: Negative for rash.  NEUROLOGICAL: Negative for dizziness and headaches.  PSYCHIATRIC: Negative for sleep disturbance and to question depression screen.  Negative for depression, negative for anhedonia.        Objective:   PHYSICAL EXAM:   VS: BP 126/68   Pulse 72   Temp (!) 95.1 F (35.1 C)   Ht _0  (1.651 m)   Wt 263 lb (119.3 kg)   SpO2 98%   BMI 43.77 kg/m   GEN: Well nourished, well developed, in no acute distress  Cardiac: RRR; no murmurs, rubs, or gallops,no edema -  Respiratory:  normal respiratory rate and pattern with no distress - normal breath sounds with no rales, rhonchi, wheezes or rubs MS: no deformity or atrophy  Skin: warm and dry, no rash  Neuro:  Alert and Oriented x 3, - CN II-Xii grossly intact Psych: euthymic mood, appropriate affect and demeanor  Diabetic Foot Exam - Simple   No data filed      BP 126/68   Pulse 72   Temp (!) 95.1 F (35.1 C)   Ht _1  (1.651 m)   Wt 263 lb (119.3 kg)   SpO2 98%   BMI 43.77 kg/m  Wt Readings from Last 3 Encounters:  04/17/21 263 lb (119.3 kg)  12/06/20 271 lb (122.9 kg)  08/24/20 258 lb 12.8 oz (117.4 kg)     Health Maintenance Due  Topic Date Due   OPHTHALMOLOGY EXAM  Never done   PAP SMEAR-Modifier  Never done   Pneumococcal Vaccine 50-60 Years old (2 - PCV) 05/11/2021    There are no preventive care reminders to display for this patient.  Lab Results  Component Value Date   TSH 3.370 08/24/2020   Lab Results  Component Value Date   WBC 9.5 12/06/2020   HGB 13.2 12/06/2020   HCT 40.6 12/06/2020   MCV 94 12/06/2020   PLT 275 12/06/2020   Lab Results  Component Value  Date   NA 138 12/06/2020   K 4.6 12/06/2020   CO2 26 12/06/2020   GLUCOSE 216 (H) 12/06/2020   BUN 20 12/06/2020   CREATININE 0.88 12/06/2020   BILITOT 0.4 12/06/2020   ALKPHOS 81 12/06/2020   AST 15 12/06/2020   ALT 21 12/06/2020   PROT 6.7 12/06/2020   ALBUMIN 3.7 (L) 12/06/2020   CALCIUM 9.4 12/06/2020  ANIONGAP 11 06/27/2020   EGFR 74 12/06/2020   Lab Results  Component Value Date   CHOL 152 12/06/2020   Lab Results  Component Value Date   HDL 34 (L) 12/06/2020   Lab Results  Component Value Date   LDLCALC 87 12/06/2020   Lab Results  Component Value Date   TRIG 181 (H) 12/06/2020   Lab Results  Component Value Date   CHOLHDL 4.5 (H) 12/06/2020   Lab Results  Component Value Date   HGBA1C 9.3 (H) 12/06/2020      Assessment & Plan:   Problem List Items Addressed This Visit       Cardiovascular and Mediastinum   Essential hypertension - Primary   Relevant Orders   CBC with Differential/Platelet   Comprehensive metabolic panel   TSH     Endocrine   Diabetes mellitus type II, non insulin dependent (HCC)   Relevant Orders   Hemoglobin A1c Continue current meds and watch diet             Other   Mixed hyperlipidemia   Relevant Orders   Lipid panel Watch diet   Continue crestor 27m qd                  Other Visit Diagnoses     Rheumatoid arthritis involving multiple sites, unspecified whether rheumatoid factor present ([place]       Relevant Orders    Keep appt with specialist         Flu shot given       No orders of the defined types were placed in this encounter.   Follow-up: Return in about 3 months (around 07/18/2021) for fasting physical and pap smear.    SARA R Orie Baxendale, PA-C

## 2021-04-18 LAB — CARDIOVASCULAR RISK ASSESSMENT

## 2021-04-18 LAB — TSH: TSH: 3.02 u[IU]/mL (ref 0.450–4.500)

## 2021-04-18 LAB — HEMOGLOBIN A1C
Est. average glucose Bld gHb Est-mCnc: 177 mg/dL
Hgb A1c MFr Bld: 7.8 % — ABNORMAL HIGH (ref 4.8–5.6)

## 2021-04-18 LAB — COMPREHENSIVE METABOLIC PANEL
ALT: 25 IU/L (ref 0–32)
AST: 17 IU/L (ref 0–40)
Albumin/Globulin Ratio: 1.6 (ref 1.2–2.2)
Albumin: 4.1 g/dL (ref 3.8–4.8)
Alkaline Phosphatase: 60 IU/L (ref 44–121)
BUN/Creatinine Ratio: 20 (ref 12–28)
BUN: 21 mg/dL (ref 8–27)
Bilirubin Total: 0.3 mg/dL (ref 0.0–1.2)
CO2: 25 mmol/L (ref 20–29)
Calcium: 10.1 mg/dL (ref 8.7–10.3)
Chloride: 102 mmol/L (ref 96–106)
Creatinine, Ser: 1.04 mg/dL — ABNORMAL HIGH (ref 0.57–1.00)
Globulin, Total: 2.6 g/dL (ref 1.5–4.5)
Glucose: 139 mg/dL — ABNORMAL HIGH (ref 65–99)
Potassium: 4.5 mmol/L (ref 3.5–5.2)
Sodium: 141 mmol/L (ref 134–144)
Total Protein: 6.7 g/dL (ref 6.0–8.5)
eGFR: 61 mL/min/{1.73_m2} (ref >59)

## 2021-04-18 LAB — CBC WITH DIFFERENTIAL/PLATELET
Basophils Absolute: 0.1 10*3/uL (ref 0.0–0.2)
Basos: 1 %
EOS (ABSOLUTE): 0.5 10*3/uL — ABNORMAL HIGH (ref 0.0–0.4)
Eos: 5 %
Hematocrit: 38.9 % (ref 34.0–46.6)
Hemoglobin: 12.7 g/dL (ref 11.1–15.9)
Immature Grans (Abs): 0 10*3/uL (ref 0.0–0.1)
Immature Granulocytes: 0 %
Lymphocytes Absolute: 2.5 10*3/uL (ref 0.7–3.1)
Lymphs: 28 %
MCH: 31 pg (ref 26.6–33.0)
MCHC: 32.6 g/dL (ref 31.5–35.7)
MCV: 95 fL (ref 79–97)
Monocytes Absolute: 0.5 10*3/uL (ref 0.1–0.9)
Monocytes: 6 %
Neutrophils Absolute: 5.3 10*3/uL (ref 1.4–7.0)
Neutrophils: 60 %
Platelets: 293 10*3/uL (ref 150–450)
RBC: 4.1 x10E6/uL (ref 3.77–5.28)
RDW: 13.1 % (ref 11.7–15.4)
WBC: 9 10*3/uL (ref 3.4–10.8)

## 2021-04-18 LAB — LIPID PANEL
Chol/HDL Ratio: 4.4 ratio (ref 0.0–4.4)
Cholesterol, Total: 146 mg/dL (ref 100–199)
HDL: 33 mg/dL — ABNORMAL LOW (ref >39)
LDL Chol Calc (NIH): 76 mg/dL (ref 0–99)
Triglycerides: 223 mg/dL — ABNORMAL HIGH (ref 0–149)
VLDL Cholesterol Cal: 37 mg/dL (ref 5–40)

## 2021-04-19 ENCOUNTER — Other Ambulatory Visit: Payer: Self-pay | Admitting: Physician Assistant

## 2021-04-19 ENCOUNTER — Other Ambulatory Visit: Payer: Self-pay | Admitting: Family Medicine

## 2021-05-16 ENCOUNTER — Encounter: Payer: Self-pay | Admitting: Physician Assistant

## 2021-07-13 ENCOUNTER — Other Ambulatory Visit: Payer: Self-pay | Admitting: Family Medicine

## 2021-07-13 DIAGNOSIS — E119 Type 2 diabetes mellitus without complications: Secondary | ICD-10-CM

## 2021-07-24 ENCOUNTER — Encounter: Payer: 59 | Admitting: Physician Assistant

## 2021-08-15 ENCOUNTER — Other Ambulatory Visit: Payer: Self-pay | Admitting: Family Medicine

## 2021-08-15 DIAGNOSIS — E119 Type 2 diabetes mellitus without complications: Secondary | ICD-10-CM

## 2021-09-21 ENCOUNTER — Other Ambulatory Visit: Payer: Self-pay | Admitting: Family Medicine

## 2021-09-21 DIAGNOSIS — E119 Type 2 diabetes mellitus without complications: Secondary | ICD-10-CM

## 2021-09-27 ENCOUNTER — Encounter: Payer: 59 | Admitting: Physician Assistant

## 2021-10-04 ENCOUNTER — Other Ambulatory Visit: Payer: Self-pay | Admitting: Physician Assistant

## 2021-10-04 ENCOUNTER — Other Ambulatory Visit: Payer: Self-pay | Admitting: Family Medicine

## 2021-10-24 ENCOUNTER — Encounter: Payer: 59 | Admitting: Physician Assistant

## 2021-11-13 ENCOUNTER — Other Ambulatory Visit: Payer: Self-pay | Admitting: Physician Assistant

## 2021-12-05 ENCOUNTER — Ambulatory Visit: Payer: 59 | Admitting: Physician Assistant

## 2021-12-05 ENCOUNTER — Encounter: Payer: Self-pay | Admitting: Physician Assistant

## 2021-12-05 VITALS — BP 114/68 | HR 85 | Resp 16 | Ht 65.0 in | Wt 254.0 lb

## 2021-12-05 DIAGNOSIS — E782 Mixed hyperlipidemia: Secondary | ICD-10-CM | POA: Diagnosis not present

## 2021-12-05 DIAGNOSIS — I10 Essential (primary) hypertension: Secondary | ICD-10-CM

## 2021-12-05 DIAGNOSIS — E559 Vitamin D deficiency, unspecified: Secondary | ICD-10-CM

## 2021-12-05 DIAGNOSIS — E119 Type 2 diabetes mellitus without complications: Secondary | ICD-10-CM

## 2021-12-05 DIAGNOSIS — F419 Anxiety disorder, unspecified: Secondary | ICD-10-CM

## 2021-12-05 DIAGNOSIS — M069 Rheumatoid arthritis, unspecified: Secondary | ICD-10-CM

## 2021-12-05 MED ORDER — RYBELSUS 14 MG PO TABS: 1.0000 | ORAL_TABLET | Freq: Every day | ORAL | 1 refills | Status: DC

## 2021-12-05 NOTE — Progress Notes (Signed)
? ?Established Patient Office Visit ? ?Subjective:  ?Patient ID: Alexandra [name], female    DOB: [birth year]  Age: 66 y.o. MRN: [id number] ? ?CC:  ?Chief Complaint  ?Patient presents with  ? Diabetes follow up  ? ? ?HPI ?Alexandra [name] presents for follow up diabetes ?Pt states that she has not been checking her glucose at all - says she is taking her medications of rybelsus 19m qd,  and glucophage 10016mbid - she is also on altace 3m3md ?She is overdue for eye appt ? ?Pt with history of hyperlipidemia - pt states she is taking crestor 3mg3m - not watching diet but states over the past few months she has not had an appetite and just not eating - no change in bms/no nausea or vomiting ? ?Pt with history of RA - she sees Dr BeekAmil Amen has her on methotrexate and prednisone -  ? ?Pt on Celexa 20mg1mfor anxiety -she is doing well on this current medication - voices no problems or concerns ? ?Pt due for mammogram ? ?Past Medical History:  ?Diagnosis Date  ? Gastro-esophageal reflux disease without esophagitis   ? Major depressive disorder, single episode, mild (HCC) [place] Mixed hyperlipidemia   ? Other cholangitis   ? Other fatigue   ? Other specified rheumatoid arthritis, multiple sites (HCC)[facility] Perioral dermatitis   ? Type 2 diabetes mellitus with other specified complication (HCC) [place] ? ?Past Surgical History:  ?Procedure Laterality Date  ? ANTERIOR CERVICAL DECOMP/DISCECTOMY FUSION N/A 06/27/2020  ? Procedure: Cervical three-four Anterior cervical decompression/discectomy/fusion;  Surgeon: SternErline Levine  [facility];  Laterality: N/A;  ? CERVICAL SPINE SURGERY  06/2020  ? CHOLECYSTECTOMY    ? ERCP    ? ? ?Family History  ?Problem Relation Age of Onset  ? Emphysema Mother   ? Cancer Father   ? Neuropathy Neg Hx   ? ? ?Social History  ? ?Socioeconomic History  ? Marital status: Single  ?  Spouse name: Not on file  ? Number of children: Not on file  ? Years of education: Not on file  ? Highest  education level: High school graduate  ?Occupational History  ? Not on file  ?Tobacco Use  ? Smoking status: Never  ? Smokeless tobacco: Never  ?Vaping Use  ? Vaping Use: Never used  ?Substance and Sexual Activity  ? Alcohol use: Never  ? Drug use: Never  ? Sexual activity: Not on file  ?Other Topics Concern  ? Not on file  ?Social History Narrative  ? Lives alone  ? Right handed  ? Caffeine: never  ? ?Social Determinants of Health  ? ?Financial Resource Strain: Not on file  ?Food Insecurity: Not on file  ?Transportation Needs: Not on file  ?Physical Activity: Not on file  ?Stress: Not on file  ?Social Connections: Not on file  ?Intimate Partner Violence: Not on file  ? ? ? ?Current Outpatient Medications:  ?  ascorbic acid (VITAMIN C) 500 MG tablet, Take 500 mg by mouth daily., Disp: , Rfl:  ?  citalopram (CELEXA) 20 MG tablet, TAKE 1 TABLET BY MOUTH DAILY, Disp: 90 tablet, Rfl: 0 ?  folic acid (FOLVITE) 1 MG tablet, Take 1 mg by mouth daily., Disp: , Rfl:  ?  metFORMIN (GLUCOPHAGE) 1000 MG tablet, TAKE 1 TABLET BY MOUTH TWICE DAILY WITH A MEAL, Disp: 180 tablet, Rfl: 0 ?  methotrexate (RHEUMATREX) 2.5 MG tablet,  Take 2.5 mg by mouth once a week. Take 10 tablets by mouth weekly, Disp: , Rfl:  ?  mometasone (ELOCON) 0.1 % cream, APPLY TO AFFECTED AREA TWICE DAILY, Disp: 15 g, Rfl: 1 ?  montelukast (SINGULAIR) 10 MG tablet, TAKE 1 TABLET BY MOUTH DAILY, Disp: 90 tablet, Rfl: 3 ?  predniSONE (DELTASONE) 5 MG tablet, TAKE 1 TABLET BY MOUTH DAILY AS NEEDED, Disp: 30 tablet, Rfl: 1 ?  ramipril (ALTACE) 5 MG capsule, TAKE 1 CAPSULE BY MOUTH DAILY, Disp: 90 capsule, Rfl: 0 ?  rosuvastatin (CRESTOR) 5 MG tablet, TAKE 1 TABLET BY MOUTH DAILY, Disp: 90 tablet, Rfl: 0 ?  Semaglutide (RYBELSUS) 14 MG TABS, Take 1 tablet (14 mg total) by mouth daily., Disp: 90 tablet, Rfl: 1 ?  solifenacin (VESICARE) 5 MG tablet, TAKE 1 TABLET BY MOUTH DAILY, Disp: 30 tablet, Rfl: 2 ? ? ?No Known Allergies ?CONSTITUTIONAL: Negative for chills,  fatigue, fever, unintentional weight gain and unintentional weight loss.  ?E/N/T: Negative for ear pain, nasal congestion and sore throat.  ?CARDIOVASCULAR: Negative for chest pain, dizziness, palpitations and pedal edema.  ?RESPIRATORY: Negative for recent cough and dyspnea.  ?GASTROINTESTINAL: Negative for abdominal pain, acid reflux symptoms, constipation, diarrhea, nausea and vomiting.  ?MSK: Negative for arthralgias and myalgias.  ?INTEGUMENTARY: Negative for rash.  ?NEUROLOGICAL: Negative for dizziness and headaches.  ?PSYCHIATRIC: Negative for sleep disturbance and to question depression screen.  Negative for depression, negative for anhedonia.  ?   ? ? ?  ?Objective: ?PHYSICAL EXAM:  ? ?VS: BP 114/68   Pulse 85   Resp 16   Ht _0  (1.651 m)   Wt 254 lb (115.2 kg)   SpO2 95%   BMI 42.27 kg/m?  ? ?GEN: Well nourished, well developed, in no acute distress  ?HEENT: normal external ears and nose - normal external auditory canals and TMS - - Lips, Teeth and Gums - normal  ?Oropharynx - normal mucosa, palate, and posterior pharynx ?Neck: no JVD or masses - no thyromegaly ?Cardiac: RRR; no murmurs, rubs, or gallops,no edema - ?Respiratory:  normal respiratory rate and pattern with no distress - normal breath sounds with no rales, rhonchi, wheezes or rubs ?Skin: warm and dry, no rash  ?Neuro:  Alert and Oriented x 3, Strength and sensation are intact - CN II-Xii grossly intact ?Psych: euthymic mood, appropriate affect and demeanor ? ? ? ?Health Maintenance Due  ?Topic Date Due  ? COVID-19 Vaccine (3 - Moderna risk series) 08/31/2020  ? HEMOGLOBIN A1C  10/16/2021  ? ? ?There are no preventive care reminders to display for this patient. ? ?Lab Results  ?Component Value Date  ? TSH 3.020 04/17/2021  ? ?Lab Results  ?Component Value Date  ? WBC 9.0 04/17/2021  ? HGB 12.7 04/17/2021  ? HCT 38.9 04/17/2021  ? MCV 95 04/17/2021  ? PLT 293 04/17/2021  ? ?Lab Results  ?Component Value Date  ? NA 141 04/17/2021  ? K 4.5  04/17/2021  ? CO2 25 04/17/2021  ? GLUCOSE 139 (H) 04/17/2021  ? BUN 21 04/17/2021  ? CREATININE 1.04 (H) 04/17/2021  ? BILITOT 0.3 04/17/2021  ? ALKPHOS 60 04/17/2021  ? AST 17 04/17/2021  ? ALT 25 04/17/2021  ? PROT 6.7 04/17/2021  ? ALBUMIN 4.1 04/17/2021  ? CALCIUM 10.1 04/17/2021  ? ANIONGAP 11 06/27/2020  ? EGFR 61 04/17/2021  ? ?Lab Results  ?Component Value Date  ? CHOL 146 04/17/2021  ? ?Lab Results  ?Component Value Date  ?  HDL 33 (L) 04/17/2021  ? ?Lab Results  ?Component Value Date  ? [place] 76 04/17/2021  ? ?Lab Results  ?Component Value Date  ? TRIG 223 (H) 04/17/2021  ? ?Lab Results  ?Component Value Date  ? CHOLHDL 4.4 04/17/2021  ? ?Lab Results  ?Component Value Date  ? HGBA1C 7.8 (H) 04/17/2021  ? ? ?  ?Assessment & Plan:  ? ?Problem List Items Addressed This Visit   ? ?  ? Cardiovascular and Mediastinum  ? Essential hypertension - Primary  ? Relevant Orders  ? CBC with Differential/Platelet  ? Comprehensive metabolic panel  ? TSH  ?  ? Endocrine  ? Diabetes mellitus type II, non insulin dependent ([place])  ? Relevant Orders  ? Hemoglobin A1c  ?  ?   ?   ?  ? Other  ? Mixed hyperlipidemia  ? Relevant Orders  ? Lipid panel ?Watch diet  ?   ?   ?   ?   ?   ? ?  ? ?Other Visit Diagnoses   ? ? Rheumatoid arthritis involving multiple sites, unspecified whether rheumatoid factor present ([place])      ? Relevant Orders  ? Continue meds and follow up with rheumatology as directed  ? Encounter for screening mammogram for breast cancer      ? Relevant Orders  ? MM Digital Screening  ? ?  ? ? ?Meds ordered this encounter  ?Medications  ? Semaglutide (RYBELSUS) 14 MG TABS  ?  Sig: Take 1 tablet (14 mg total) by mouth daily.  ?  Dispense:  90 tablet  ?  Refill:  1  ?  Order Specific Question:   Supervising Provider  ?  AnswerRochel Brome [[id number]]  ? ? ?Follow-up: Return in about 6 months (around 06/06/2022) for chronic fasting follow up.  ?

## 2021-12-06 ENCOUNTER — Other Ambulatory Visit: Payer: Self-pay | Admitting: Physician Assistant

## 2021-12-06 LAB — CBC WITH DIFFERENTIAL/PLATELET
Basophils Absolute: 0.1 10*3/uL (ref 0.0–0.2)
Basos: 1 %
EOS (ABSOLUTE): 0.2 10*3/uL (ref 0.0–0.4)
Eos: 2 %
Hematocrit: 40.9 % (ref 34.0–46.6)
Hemoglobin: 13.5 g/dL (ref 11.1–15.9)
Immature Grans (Abs): 0 10*3/uL (ref 0.0–0.1)
Immature Granulocytes: 0 %
Lymphocytes Absolute: 1.7 10*3/uL (ref 0.7–3.1)
Lymphs: 21 %
MCH: 30.6 pg (ref 26.6–33.0)
MCHC: 33 g/dL (ref 31.5–35.7)
MCV: 93 fL (ref 79–97)
Monocytes Absolute: 0.8 10*3/uL (ref 0.1–0.9)
Monocytes: 10 %
Neutrophils Absolute: 5.5 10*3/uL (ref 1.4–7.0)
Neutrophils: 66 %
Platelets: 285 10*3/uL (ref 150–450)
RBC: 4.41 x10E6/uL (ref 3.77–5.28)
RDW: 13.4 % (ref 11.7–15.4)
WBC: 8.3 10*3/uL (ref 3.4–10.8)

## 2021-12-06 LAB — COMPREHENSIVE METABOLIC PANEL
ALT: 23 IU/L (ref 0–32)
AST: 20 IU/L (ref 0–40)
Albumin/Globulin Ratio: 1.8 (ref 1.2–2.2)
Albumin: 4.5 g/dL (ref 3.8–4.8)
Alkaline Phosphatase: 66 IU/L (ref 44–121)
BUN/Creatinine Ratio: 21 (ref 12–28)
BUN: 30 mg/dL — ABNORMAL HIGH (ref 8–27)
Bilirubin Total: 0.4 mg/dL (ref 0.0–1.2)
CO2: 21 mmol/L (ref 20–29)
Calcium: 9.9 mg/dL (ref 8.7–10.3)
Chloride: 103 mmol/L (ref 96–106)
Creatinine, Ser: 1.42 mg/dL — ABNORMAL HIGH (ref 0.57–1.00)
Globulin, Total: 2.5 g/dL (ref 1.5–4.5)
Glucose: 139 mg/dL — ABNORMAL HIGH (ref 70–99)
Potassium: 5.1 mmol/L (ref 3.5–5.2)
Sodium: 142 mmol/L (ref 134–144)
Total Protein: 7 g/dL (ref 6.0–8.5)
eGFR: 42 mL/min/{1.73_m2} — ABNORMAL LOW (ref >59)

## 2021-12-06 LAB — LIPID PANEL
Chol/HDL Ratio: 4.2 ratio (ref 0.0–4.4)
Cholesterol, Total: 137 mg/dL (ref 100–199)
HDL: 33 mg/dL — ABNORMAL LOW (ref >39)
LDL Chol Calc (NIH): 73 mg/dL (ref 0–99)
Triglycerides: 180 mg/dL — ABNORMAL HIGH (ref 0–149)
VLDL Cholesterol Cal: 31 mg/dL (ref 5–40)

## 2021-12-06 LAB — HM MAMMOGRAPHY

## 2021-12-06 LAB — HEMOGLOBIN A1C
Est. average glucose Bld gHb Est-mCnc: 160 mg/dL
Hgb A1c MFr Bld: 7.2 % — ABNORMAL HIGH (ref 4.8–5.6)

## 2021-12-06 LAB — VITAMIN D 25 HYDROXY (VIT D DEFICIENCY, FRACTURES): Vit D, 25-Hydroxy: 22.8 ng/mL — ABNORMAL LOW (ref 30.0–100.0)

## 2021-12-06 LAB — TSH: TSH: 2.78 u[IU]/mL (ref 0.450–4.500)

## 2021-12-06 LAB — CARDIOVASCULAR RISK ASSESSMENT

## 2021-12-06 MED ORDER — VITAMIN D (ERGOCALCIFEROL) 1.25 MG (50000 UNIT) PO CAPS: 50000.0000 [IU] | ORAL_CAPSULE | ORAL | 5 refills | Status: DC

## 2021-12-07 ENCOUNTER — Encounter: Payer: 59 | Admitting: Physician Assistant

## 2022-02-01 ENCOUNTER — Other Ambulatory Visit: Payer: Self-pay

## 2022-02-01 DIAGNOSIS — E782 Mixed hyperlipidemia: Secondary | ICD-10-CM

## 2022-02-01 MED ORDER — ROSUVASTATIN CALCIUM 5 MG PO TABS: 5.0000 mg | ORAL_TABLET | Freq: Every day | ORAL | 0 refills | Status: DC

## 2022-02-04 ENCOUNTER — Other Ambulatory Visit: Payer: Self-pay | Admitting: Physician Assistant

## 2022-02-04 DIAGNOSIS — E119 Type 2 diabetes mellitus without complications: Secondary | ICD-10-CM

## 2022-02-04 MED ORDER — RAMIPRIL 5 MG PO CAPS: 5.0000 mg | ORAL_CAPSULE | Freq: Every day | ORAL | 0 refills | Status: DC

## 2022-02-21 ENCOUNTER — Other Ambulatory Visit: Payer: Self-pay | Admitting: Physician Assistant

## 2022-02-21 DIAGNOSIS — E119 Type 2 diabetes mellitus without complications: Secondary | ICD-10-CM

## 2022-02-21 MED ORDER — METFORMIN HCL 1000 MG PO TABS: 1000.0000 mg | ORAL_TABLET | Freq: Two times a day (BID) | ORAL | 0 refills | Status: DC

## 2022-04-18 ENCOUNTER — Other Ambulatory Visit: Payer: Self-pay

## 2022-04-18 MED ORDER — CITALOPRAM HYDROBROMIDE 20 MG PO TABS
20.0000 mg | ORAL_TABLET | Freq: Every day | ORAL | 0 refills | Status: DC
Start: 2022-04-18 — End: 2022-10-09

## 2022-04-30 ENCOUNTER — Other Ambulatory Visit: Payer: Self-pay

## 2022-04-30 DIAGNOSIS — E119 Type 2 diabetes mellitus without complications: Secondary | ICD-10-CM

## 2022-04-30 MED ORDER — RYBELSUS 14 MG PO TABS: 1.0000 | ORAL_TABLET | Freq: Every day | ORAL | 0 refills | Status: DC

## 2022-05-08 ENCOUNTER — Other Ambulatory Visit: Payer: Self-pay

## 2022-05-08 MED ORDER — SOLIFENACIN SUCCINATE 5 MG PO TABS: 5.0000 mg | ORAL_TABLET | Freq: Every day | ORAL | 2 refills | Status: DC

## 2022-06-11 ENCOUNTER — Ambulatory Visit: Payer: 59 | Admitting: Physician Assistant

## 2022-06-11 DIAGNOSIS — R7989 Other specified abnormal findings of blood chemistry: Secondary | ICD-10-CM | POA: Diagnosis not present

## 2022-06-11 DIAGNOSIS — Z79899 Other long term (current) drug therapy: Secondary | ICD-10-CM | POA: Diagnosis not present

## 2022-06-11 DIAGNOSIS — M1991 Primary osteoarthritis, unspecified site: Secondary | ICD-10-CM | POA: Diagnosis not present

## 2022-06-11 DIAGNOSIS — M0579 Rheumatoid arthritis with rheumatoid factor of multiple sites without organ or systems involvement: Secondary | ICD-10-CM | POA: Diagnosis not present

## 2022-06-12 ENCOUNTER — Encounter: Payer: Self-pay | Admitting: Physician Assistant

## 2022-06-17 ENCOUNTER — Other Ambulatory Visit: Payer: Self-pay | Admitting: Physician Assistant

## 2022-06-26 ENCOUNTER — Encounter: Payer: Self-pay | Admitting: Physician Assistant

## 2022-06-26 ENCOUNTER — Ambulatory Visit (INDEPENDENT_AMBULATORY_CARE_PROVIDER_SITE_OTHER): Payer: BC Managed Care – PPO | Admitting: Physician Assistant

## 2022-06-26 VITALS — BP 116/68 | HR 67 | Temp 97.9°F | Ht 65.0 in | Wt 259.4 lb

## 2022-06-26 DIAGNOSIS — E782 Mixed hyperlipidemia: Secondary | ICD-10-CM

## 2022-06-26 DIAGNOSIS — E559 Vitamin D deficiency, unspecified: Secondary | ICD-10-CM

## 2022-06-26 DIAGNOSIS — I10 Essential (primary) hypertension: Secondary | ICD-10-CM | POA: Diagnosis not present

## 2022-06-26 DIAGNOSIS — Z23 Encounter for immunization: Secondary | ICD-10-CM

## 2022-06-26 DIAGNOSIS — E119 Type 2 diabetes mellitus without complications: Secondary | ICD-10-CM

## 2022-06-26 DIAGNOSIS — F419 Anxiety disorder, unspecified: Secondary | ICD-10-CM

## 2022-06-26 DIAGNOSIS — M069 Rheumatoid arthritis, unspecified: Secondary | ICD-10-CM

## 2022-06-26 MED ORDER — MOMETASONE FUROATE 0.1 % EX CREA: TOPICAL_CREAM | CUTANEOUS | 2 refills | Status: DC

## 2022-06-26 NOTE — Progress Notes (Signed)
Established Patient Office Visit  Subjective:  Patient ID: Alexandra [name], female    DOB: 10-21-1955  Age: 66 y.o. MRN: [id number]  CC:  Chief Complaint  Patient presents with   Diabetes follow up   Pt last visit 4/23 HPI Alexandra [name] presents for follow up diabetes Pt states that she has not been checking her glucose at all - she says that she lost insurance earlier this year and could not afford her medications so actually had stopped all of them for at least a month and maybe more Just restarted meds about 2 weeks ago She is taking glucophage 1060m , altace 549m crestor 46m30mnd rybelsus 34m64me is overdue for eye exam and states she will schedule  Pt with history of hyperlipidemia - pt has recently restarted crestor 46mg 69m- trying to  watch diet is due for labwork  Pt with history of RA - she sees Dr BeekmAmil Amenhas her on methotrexate and prednisone - she states at this time her symptoms are stable  Pt on Celexa 20mg 39mor anxiety -she is doing well on this current medication - voices no problems or concerns  Pt with history of Vit D def - is on weekly supplement and due for labwork  Pt would like flu shot today  Past Medical History:  Diagnosis Date   Gastro-esophageal reflux disease without esophagitis    Major depressive disorder, single episode, mild (HCC)  [place]    Other cholangitis    Other fatigue    Other specified rheumatoid arthritis, multiple sites (HCC)  [place]    Type 2 diabetes mellitus with other specified complication (HCC) [facility]:  Procedure Laterality Date   ANTERIOR CERVICAL DECOMP/DISCECTOMY FUSION N/A 06/27/2020   Procedure: Cervical three-four Anterior cervical decompression/discectomy/fusion;  Surgeon: Stern,Erline Levine Location: [facility] OR;[place]: Neurosurgery;  Laterality: N/A;   CERVICAL SPINE SURGERY  06/2020   CHOLECYSTECTOMY     ERCP      Family History  Problem Relation Age of Onset    Emphysema Mother    Cancer Father    Neuropathy Neg Hx     Social History   Socioeconomic History   Marital status: Single    Spouse name: Not on file   Number of children: Not on file   Years of education: Not on file   Highest education level: High school graduate  Occupational History   Not on file  Tobacco Use   Smoking status: Never   Smokeless tobacco: Never  Vaping Use   Vaping Use: Never used  Substance and Sexual Activity   Alcohol use: Never   Drug use: Never   Sexual activity: Not on file  Other Topics Concern   Not on file  Social History Narrative   Lives alone   Right handed   Caffeine: never   Social Determinants of Health   Financial Resource Strain: Not on file  Food Insecurity: Not on file  Transportation Needs: Not on file  Physical Activity: Not on file  Stress: Not on file  Social Connections: Not on file  Intimate Partner Violence: Not on file     Current Outpatient Medications:    ascorbic acid (VITAMIN C) 500 MG tablet, Take 500 mg by mouth daily., Disp: , Rfl:    citalopram (CELEXA) 20 MG tablet, Take 1 tablet (20 mg total) by mouth daily., Disp: 90 tablet, Rfl: 0  folic acid (FOLVITE) 1 MG tablet, Take 1 mg by mouth daily., Disp: , Rfl:    metFORMIN (GLUCOPHAGE) 1000 MG tablet, Take 1 tablet (1,000 mg total) by mouth 2 (two) times daily with a meal., Disp: 180 tablet, Rfl: 0   methotrexate (RHEUMATREX) 2.5 MG tablet, Take 2.5 mg by mouth once a week. Take 10 tablets by mouth weekly, Disp: , Rfl:    montelukast (SINGULAIR) 10 MG tablet, TAKE 1 TABLET BY MOUTH DAILY, Disp: 90 tablet, Rfl: 3   predniSONE (DELTASONE) 5 MG tablet, TAKE 1 TABLET BY MOUTH DAILY AS NEEDED, Disp: 30 tablet, Rfl: 1   ramipril (ALTACE) 5 MG capsule, Take 1 capsule (5 mg total) by mouth daily., Disp: 90 capsule, Rfl: 0   rosuvastatin (CRESTOR) 5 MG tablet, Take 1 tablet (5 mg total) by mouth daily., Disp: 90 tablet, Rfl: 0   Semaglutide (RYBELSUS) 14 MG TABS,  Take 1 tablet (14 mg total) by mouth daily., Disp: 90 tablet, Rfl: 0   solifenacin (VESICARE) 5 MG tablet, Take 1 tablet (5 mg total) by mouth daily., Disp: 30 tablet, Rfl: 2   Vitamin D, Ergocalciferol, (DRISDOL) 1.25 MG (50000 UNIT) CAPS capsule, Take 1 capsule (50,000 Units total) by mouth every 7 (seven) days., Disp: 5 capsule, Rfl: 5   mometasone (ELOCON) 0.1 % cream, APPLY TO AFFECTED AREA TWICE DAILY, Disp: 15 g, Rfl: 2   No Known Allergies  CONSTITUTIONAL: Negative for chills, fatigue, fever, unintentional weight gain and unintentional weight loss.  E/N/T: Negative for ear pain, nasal congestion and sore throat.  CARDIOVASCULAR: Negative for chest pain, dizziness, palpitations and pedal edema.  RESPIRATORY: Negative for recent cough and dyspnea.  GASTROINTESTINAL: Negative for abdominal pain, acid reflux symptoms, constipation, diarrhea, nausea and vomiting.  MSK: Negative for arthralgias and myalgias.  INTEGUMENTARY: Negative for rash.  NEUROLOGICAL: Negative for dizziness and headaches.  PSYCHIATRIC: Negative for sleep disturbance and to question depression screen.  Negative for depression, negative for anhedonia.        Objective: PHYSICAL EXAM:   VS: BP 116/68 (BP Location: Left Arm, Patient Position: Sitting, Cuff Size: Large)   Pulse 67   Temp 97.9 F (36.6 C) (Temporal)   Ht _0  (1.651 m)   Wt 259 lb 6.4 oz (117.7 kg)   SpO2 100%   BMI 43.17 kg/m   GEN: Well nourished, well developed, in no acute distress   Neck: no JVD or masses - no thyromegaly Cardiac: RRR; no murmurs, rubs, or gallops,no edema -  Respiratory:  normal respiratory rate and pattern with no distress - normal breath sounds with no rales, rhonchi, wheezes or rubs  MS: no deformity or atrophy  Skin: warm and dry, no rash  Neuro:  Alert and Oriented x 3,  - CN II-Xii grossly intact Psych: euthymic mood, appropriate affect and demeanor   Health Maintenance Due  Topic Date Due   Diabetic  kidney evaluation - Urine ACR  Never done   HEMOGLOBIN A1C  06/06/2022    There are no preventive care reminders to display for this patient.  Lab Results  Component Value Date   TSH 2.780 12/05/2021   Lab Results  Component Value Date   WBC 8.3 12/05/2021   HGB 13.5 12/05/2021   HCT 40.9 12/05/2021   MCV 93 12/05/2021   PLT 285 12/05/2021   Lab Results  Component Value Date   NA 142 12/05/2021   K 5.1 12/05/2021   CO2 21 12/05/2021   GLUCOSE 139 (H)  12/05/2021   BUN 30 (H) 12/05/2021   CREATININE 1.42 (H) 12/05/2021   BILITOT 0.4 12/05/2021   ALKPHOS 66 12/05/2021   AST 20 12/05/2021   ALT 23 12/05/2021   PROT 7.0 12/05/2021   ALBUMIN 4.5 12/05/2021   CALCIUM 9.9 12/05/2021   ANIONGAP 11 06/27/2020   EGFR 42 (L) 12/05/2021   Lab Results  Component Value Date   CHOL 137 12/05/2021   Lab Results  Component Value Date   HDL 33 (L) 12/05/2021   Lab Results  Component Value Date   LDLCALC 73 12/05/2021   Lab Results  Component Value Date   TRIG 180 (H) 12/05/2021   Lab Results  Component Value Date   CHOLHDL 4.2 12/05/2021   Lab Results  Component Value Date   HGBA1C 7.2 (H) 12/05/2021      Assessment & Plan:   Problem List Items Addressed This Visit     Endocrine   Diabetes mellitus type II, non insulin dependent (HCC)   Relevant Orders   Hemoglobin A1c Cbc Cmp Continue current meds and efforts at weight loss Watch diet Schedule eye exam             Other   Mixed hyperlipidemia   Relevant Orders   Lipid panel Watch diet Continue crestor 45m qd         Vit D def Continue supplement Labwork pending    Other Visit Diagnoses     Rheumatoid arthritis involving multiple sites, unspecified whether rheumatoid factor present ([place]       Relevant Orders   Continue meds and follow up with rheumatology as directed   Flu vaccine given             Meds ordered this encounter  Medications   mometasone (ELOCON) 0.1 % cream     Sig: APPLY TO AFFECTED AREA TWICE DAILY    Dispense:  15 g    Refill:  2    Order Specific Question:   Supervising Provider    Answer:Rochel Brome[[[id number]   Follow-up: Return in about 3 months (around 09/26/2022) for fasting physical with pap - 40 min.

## 2022-06-26 NOTE — Progress Notes (Deleted)
Subjective:  Patient ID: Alexandra [name], female    DOB: [birth year]  Age: 66 y.o. MRN: [id number]  Chief Complaint  Patient presents with   Diabetes   Hypertension    HPI   Current Outpatient Medications on File Prior to Visit  Medication Sig Dispense Refill   ascorbic acid (VITAMIN C) 500 MG tablet Take 500 mg by mouth daily.     citalopram (CELEXA) 20 MG tablet Take 1 tablet (20 mg total) by mouth daily. 90 tablet 0   folic acid (FOLVITE) 1 MG tablet Take 1 mg by mouth daily.     metFORMIN (GLUCOPHAGE) 1000 MG tablet Take 1 tablet (1,000 mg total) by mouth 2 (two) times daily with a meal. 180 tablet 0   methotrexate (RHEUMATREX) 2.5 MG tablet Take 2.5 mg by mouth once a week. Take 10 tablets by mouth weekly     montelukast (SINGULAIR) 10 MG tablet TAKE 1 TABLET BY MOUTH DAILY 90 tablet 3   predniSONE (DELTASONE) 5 MG tablet TAKE 1 TABLET BY MOUTH DAILY AS NEEDED 30 tablet 1   ramipril (ALTACE) 5 MG capsule Take 1 capsule (5 mg total) by mouth daily. 90 capsule 0   rosuvastatin (CRESTOR) 5 MG tablet Take 1 tablet (5 mg total) by mouth daily. 90 tablet 0   Semaglutide (RYBELSUS) 14 MG TABS Take 1 tablet (14 mg total) by mouth daily. 90 tablet 0   solifenacin (VESICARE) 5 MG tablet Take 1 tablet (5 mg total) by mouth daily. 30 tablet 2   Vitamin D, Ergocalciferol, (DRISDOL) 1.25 MG (50000 UNIT) CAPS capsule Take 1 capsule (50,000 Units total) by mouth every 7 (seven) days. 5 capsule 5   No current facility-administered medications on file prior to visit.   Past Medical History:  Diagnosis Date   Gastro-esophageal reflux disease without esophagitis    Major depressive disorder, single episode, mild (HCC)    Mixed hyperlipidemia    Other cholangitis    Other fatigue    Other specified rheumatoid arthritis, multiple sites (HCC)    Perioral dermatitis    Type 2 diabetes mellitus with other specified complication [facility])    Past Surgical History:  Procedure Laterality Date   ANTERIOR  CERVICAL DECOMP/DISCECTOMY FUSION N/A 06/27/2020   Procedure: Cervical three-four Anterior cervical decompression/discectomy/fusion;  Surgeon: Maeola Harman, MD;  Location: [facility] OR;  Service: Neurosurgery;  Laterality: N/A;   CERVICAL SPINE SURGERY  06/2020   CHOLECYSTECTOMY     ERCP      Family History  Problem Relation Age of Onset   Emphysema Mother    Cancer Father    Neuropathy Neg Hx    Social History   Socioeconomic History   Marital status: Single    Spouse name: Not on file   Number of children: Not on file   Years of education: Not on file   Highest education level: High school graduate  Occupational History   Not on file  Tobacco Use   Smoking status: Never   Smokeless tobacco: Never  Vaping Use   Vaping Use: Never used  Substance and Sexual Activity   Alcohol use: Never   Drug use: Never   Sexual activity: Not on file  Other Topics Concern   Not on file  Social History Narrative   Lives alone   Right handed   Caffeine: never   Social Determinants of Health   Financial Resource Strain: Not on file  Food Insecurity: Not on file  Transportation Needs: Not on file  Physical Activity: Not on file  Stress: Not on file  Social Connections: Not on file    Review of Systems   Objective:  BP 116/68 (BP Location: Left Arm, Patient Position: Sitting, Cuff Size: Large)   Pulse 67   Temp 97.9 F (36.6 C) (Temporal)   Ht 5\' 5"  (1.651 m)   Wt 259 lb 6.4 oz (117.7 kg)   SpO2 100%   BMI 43.17 kg/m      06/26/2022   10:47 AM 12/05/2021    2:16 PM 04/17/2021    9:51 AM  BP/Weight  Systolic BP 116 114 126  Diastolic BP 68 68 68  Wt. (Lbs) 259.4 254 263  BMI 43.17 kg/m2 42.27 kg/m2 43.77 kg/m2    Physical Exam  Diabetic Foot Exam - Simple   Simple Foot Form Diabetic Foot exam was performed with the following findings: Yes 06/26/2022 10:49 AM  Visual Inspection No deformities, no ulcerations, no other skin breakdown bilaterally: Yes Sensation  Testing Intact to touch and monofilament testing bilaterally: Yes Pulse Check Posterior Tibialis and Dorsalis pulse intact bilaterally: Yes Comments      Lab Results  Component Value Date   WBC 8.3 12/05/2021   HGB 13.5 12/05/2021   HCT 40.9 12/05/2021   PLT 285 12/05/2021   GLUCOSE 139 (H) 12/05/2021   CHOL 137 12/05/2021   TRIG 180 (H) 12/05/2021   HDL 33 (L) 12/05/2021   LDLCALC 73 12/05/2021   ALT 23 12/05/2021   AST 20 12/05/2021   NA 142 12/05/2021   K 5.1 12/05/2021   CL 103 12/05/2021   CREATININE 1.42 (H) 12/05/2021   BUN 30 (H) 12/05/2021   CO2 21 12/05/2021   TSH 2.780 12/05/2021   HGBA1C 7.2 (H) 12/05/2021   MICROALBUR 150 02/01/2020      Assessment & Plan:   Problem List Items Addressed This Visit       Cardiovascular and Mediastinum   Essential hypertension   Relevant Orders   CBC with Differential/Platelet   Comprehensive metabolic panel   TSH     Endocrine   Diabetes mellitus type II, non insulin dependent (HCC) - Primary   Relevant Orders   CBC with Differential/Platelet   Comprehensive metabolic panel   Lipid panel   Hemoglobin A1c   Microalbumin / creatinine urine ratio     Musculoskeletal and Integument   Rheumatoid arthritis involving multiple sites (HCC)     Other   Mixed hyperlipidemia   Relevant Orders   Lipid panel   Encounter for immunization   Relevant Orders   Flu Vaccine MDCK QUAD PF (Completed)   Anxiety   Other Visit Diagnoses     Vitamin D deficiency       Relevant Orders   VITAMIN D 25 Hydroxy (Vit-D Deficiency, Fractures)   Needs flu shot         .  Meds ordered this encounter  Medications   mometasone (ELOCON) 0.1 % cream    Sig: APPLY TO AFFECTED AREA TWICE DAILY    Dispense:  15 g    Refill:  2    Order Specific Question:   Supervising Provider    Answer06/17/2021    Orders Placed This Encounter  Procedures   Flu Vaccine MDCK QUAD PF   CBC with Differential/Platelet    Comprehensive metabolic panel   TSH   Lipid panel   Hemoglobin A1c   VITAMIN D 25 Hydroxy (Vit-D Deficiency, Fractures)   Microalbumin /  creatinine urine ratio     Follow-up: Return in about 3 months (around 09/26/2022) for fasting physical with pap - 40 min.  An After Visit Summary was printed and given to the patient.  Jettie Pagan [facility] [phone]

## 2022-06-28 LAB — CARDIOVASCULAR RISK ASSESSMENT

## 2022-06-28 LAB — LIPID PANEL
Chol/HDL Ratio: 4 ratio (ref 0.0–4.4)
Cholesterol, Total: 139 mg/dL (ref 100–199)
HDL: 35 mg/dL — ABNORMAL LOW (ref >39)
LDL Chol Calc (NIH): 72 mg/dL (ref 0–99)
Triglycerides: 192 mg/dL — ABNORMAL HIGH (ref 0–149)
VLDL Cholesterol Cal: 32 mg/dL (ref 5–40)

## 2022-06-28 LAB — COMPREHENSIVE METABOLIC PANEL
ALT: 23 IU/L (ref 0–32)
AST: 18 IU/L (ref 0–40)
Albumin/Globulin Ratio: 1.5 (ref 1.2–2.2)
Albumin: 4 g/dL (ref 3.9–4.9)
Alkaline Phosphatase: 66 IU/L (ref 44–121)
BUN/Creatinine Ratio: 20 (ref 12–28)
BUN: 22 mg/dL (ref 8–27)
Bilirubin Total: 0.4 mg/dL (ref 0.0–1.2)
CO2: 24 mmol/L (ref 20–29)
Calcium: 9.9 mg/dL (ref 8.7–10.3)
Chloride: 102 mmol/L (ref 96–106)
Creatinine, Ser: 1.12 mg/dL — ABNORMAL HIGH (ref 0.57–1.00)
Globulin, Total: 2.7 g/dL (ref 1.5–4.5)
Glucose: 224 mg/dL — ABNORMAL HIGH (ref 70–99)
Potassium: 5.2 mmol/L (ref 3.5–5.2)
Sodium: 141 mmol/L (ref 134–144)
Total Protein: 6.7 g/dL (ref 6.0–8.5)
eGFR: 55 mL/min/{1.73_m2} — ABNORMAL LOW (ref >59)

## 2022-06-28 LAB — CBC WITH DIFFERENTIAL/PLATELET
Basophils Absolute: 0.1 10*3/uL (ref 0.0–0.2)
Basos: 1 %
EOS (ABSOLUTE): 0.2 10*3/uL (ref 0.0–0.4)
Eos: 3 %
Hematocrit: 40.1 % (ref 34.0–46.6)
Hemoglobin: 13.1 g/dL (ref 11.1–15.9)
Immature Grans (Abs): 0 10*3/uL (ref 0.0–0.1)
Immature Granulocytes: 0 %
Lymphocytes Absolute: 1.8 10*3/uL (ref 0.7–3.1)
Lymphs: 24 %
MCH: 30.7 pg (ref 26.6–33.0)
MCHC: 32.7 g/dL (ref 31.5–35.7)
MCV: 94 fL (ref 79–97)
Monocytes Absolute: 0.6 10*3/uL (ref 0.1–0.9)
Monocytes: 8 %
Neutrophils Absolute: 4.8 10*3/uL (ref 1.4–7.0)
Neutrophils: 64 %
Platelets: 217 10*3/uL (ref 150–450)
RBC: 4.27 x10E6/uL (ref 3.77–5.28)
RDW: 13.1 % (ref 11.7–15.4)
WBC: 7.5 10*3/uL (ref 3.4–10.8)

## 2022-06-28 LAB — HEMOGLOBIN A1C
Est. average glucose Bld gHb Est-mCnc: 280 mg/dL
Hgb A1c MFr Bld: 11.4 % — ABNORMAL HIGH (ref 4.8–5.6)

## 2022-06-28 LAB — MICROALBUMIN / CREATININE URINE RATIO
Creatinine, Urine: 97.3 mg/dL
Microalb/Creat Ratio: 57 mg/g creat — ABNORMAL HIGH (ref 0–29)
Microalbumin, Urine: 55.1 ug/mL

## 2022-06-28 LAB — TSH: TSH: 2.6 u[IU]/mL (ref 0.450–4.500)

## 2022-06-28 LAB — VITAMIN D 25 HYDROXY (VIT D DEFICIENCY, FRACTURES): Vit D, 25-Hydroxy: 29.7 ng/mL — ABNORMAL LOW (ref 30.0–100.0)

## 2022-06-29 IMAGING — MG DIGITAL SCREENING BILAT W/ CAD
4 series · 4 of 4 positions shown · non-contrast
Comparison: Previous exam(s).

CLINICAL DATA: Screening.

EXAM:
DIGITAL SCREENING BILATERAL MAMMOGRAM WITH CAD
TECHNIQUE: Bilateral screening digital craniocaudal and mediolateral oblique
mammograms were obtained. The images were evaluated with
computer-aided detection.

[L CC]
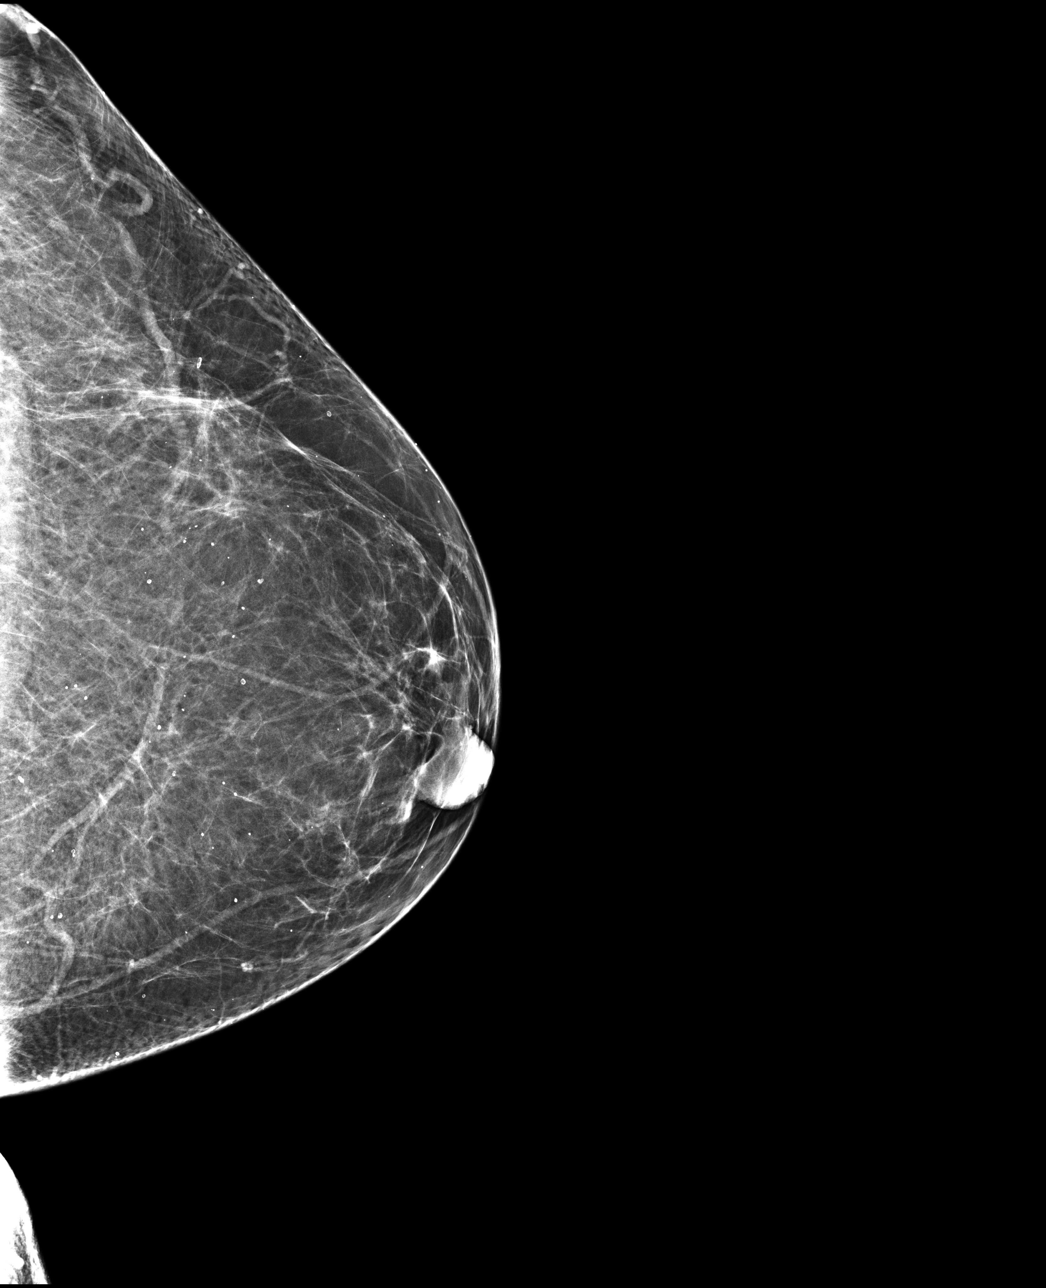

[R CC]
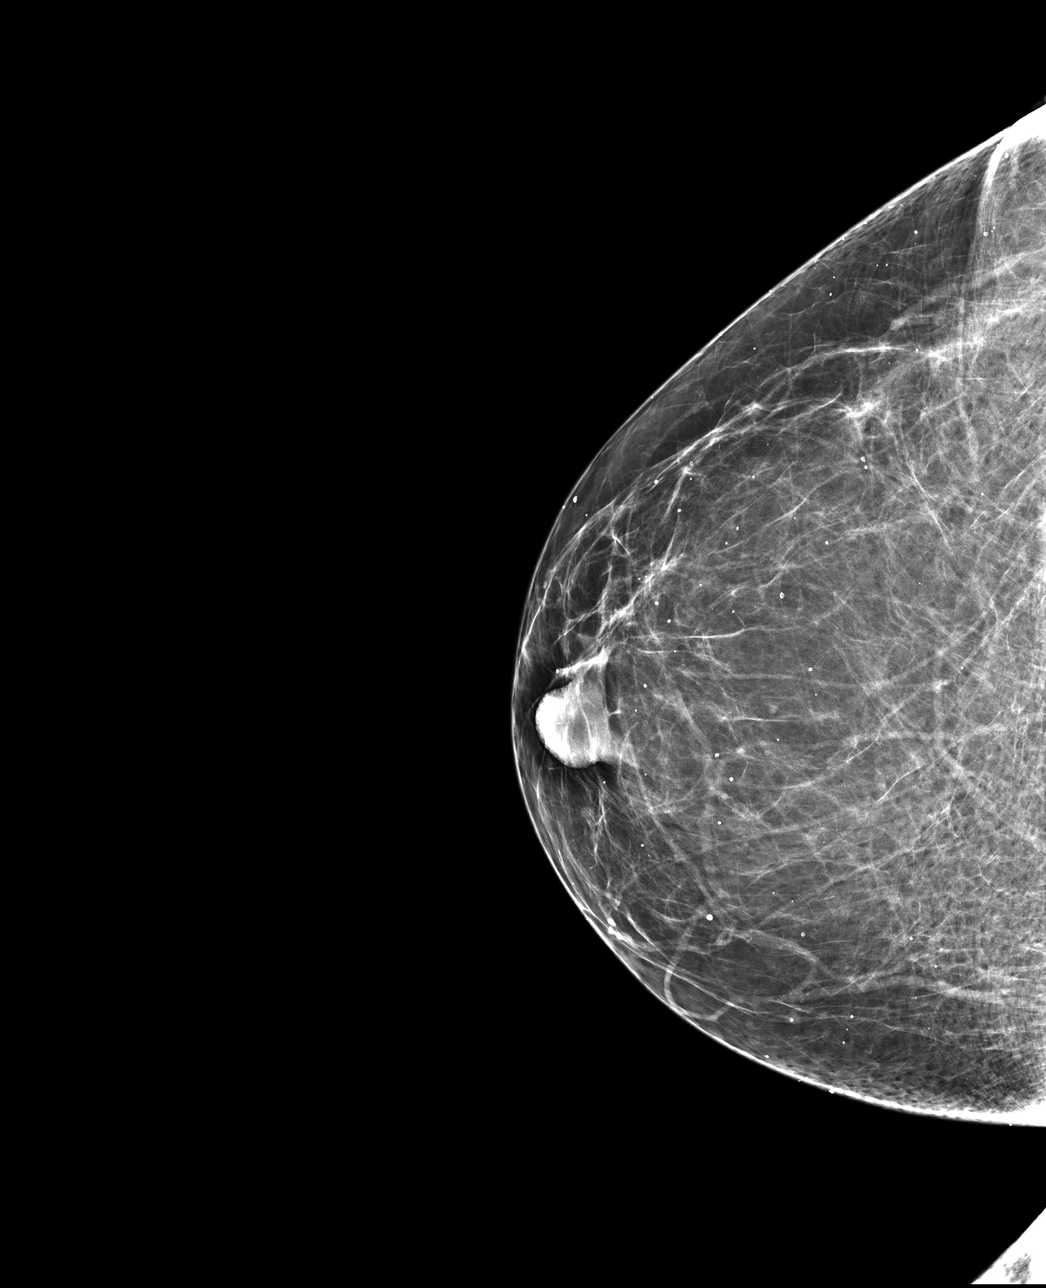

[R MLO]
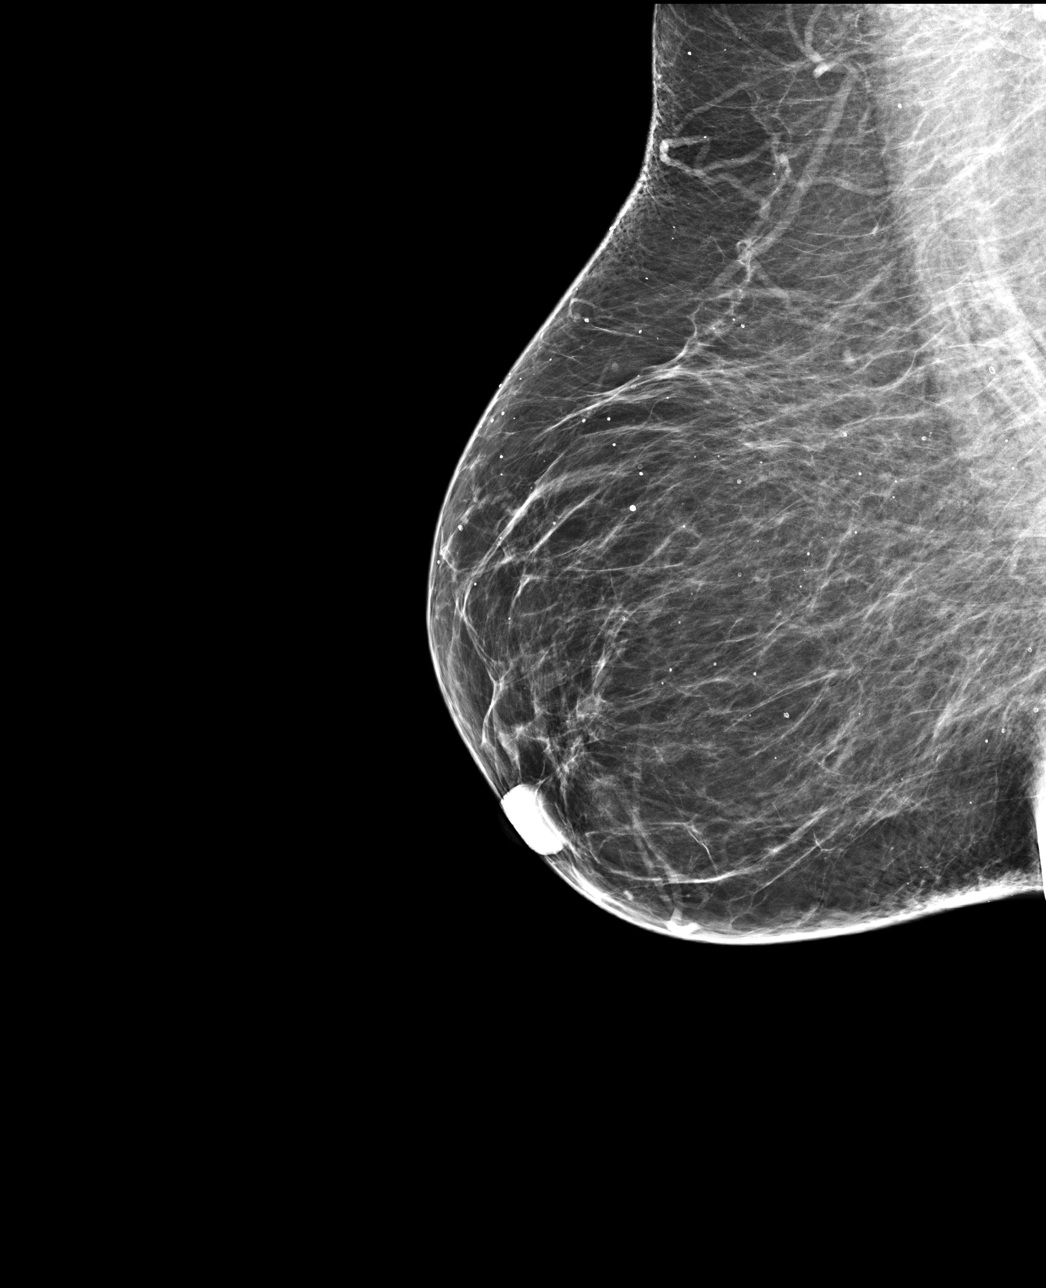

[L MLO]
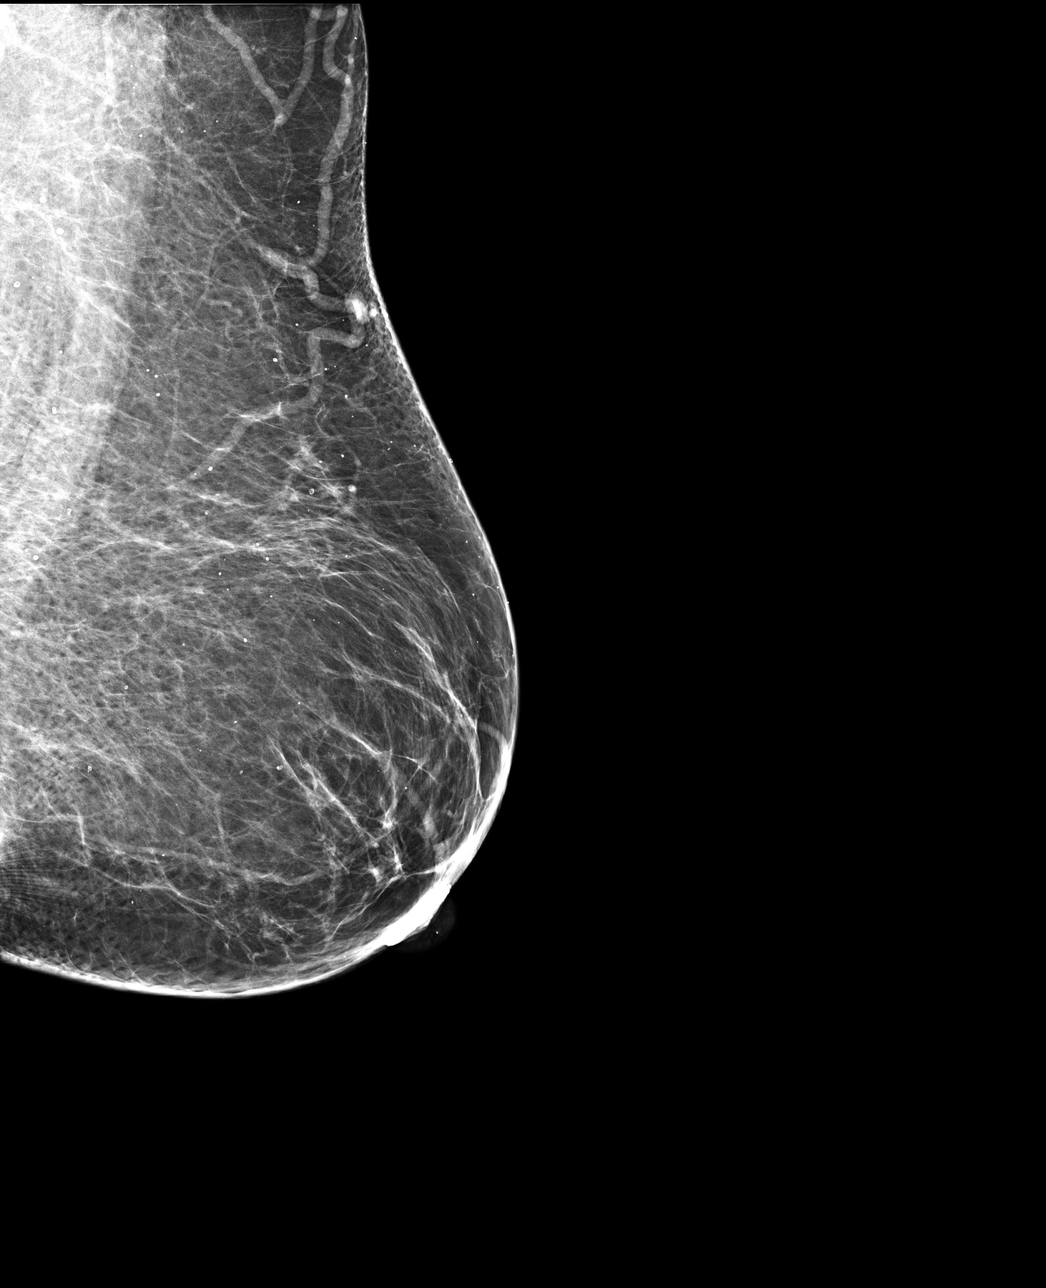

[4 of 4 positions shown; findings below may reference images not displayed]

ACR Breast Density Category b: There are scattered areas of
fibroglandular density.
FINDINGS: There are no findings suspicious for malignancy. The images were
evaluated with computer-aided detection.
IMPRESSION: No mammographic evidence of malignancy. A result letter of this
screening mammogram will be mailed directly to the patient.

RECOMMENDATION:
Screening mammogram in one year. (Code:XC-Q-XW6)

BI-RADS CATEGORY  1: Negative.

## 2022-07-04 ENCOUNTER — Other Ambulatory Visit: Payer: Self-pay | Admitting: Nurse Practitioner

## 2022-07-04 DIAGNOSIS — E782 Mixed hyperlipidemia: Secondary | ICD-10-CM

## 2022-07-04 DIAGNOSIS — E119 Type 2 diabetes mellitus without complications: Secondary | ICD-10-CM

## 2022-07-04 MED ORDER — RAMIPRIL 5 MG PO CAPS: 5.0000 mg | ORAL_CAPSULE | Freq: Every day | ORAL | 0 refills | Status: DC

## 2022-08-05 ENCOUNTER — Telehealth: Payer: Self-pay

## 2022-08-05 DIAGNOSIS — J101 Influenza due to other identified influenza virus with other respiratory manifestations: Secondary | ICD-10-CM | POA: Diagnosis not present

## 2022-08-05 DIAGNOSIS — R051 Acute cough: Secondary | ICD-10-CM | POA: Diagnosis not present

## 2022-08-05 NOTE — Telephone Encounter (Signed)
Patient called this morning to make an appointment for sinus congestion and was requesting to be tested for COVID. Patient notified that sally does not have an opening for today. However, I offered to schedule her an appointment with Rekha, FN for this morning. Patient stated that she was wanting to see sally and ended the call.

## 2022-08-07 ENCOUNTER — Encounter: Payer: Self-pay | Admitting: Physician Assistant

## 2022-08-07 ENCOUNTER — Ambulatory Visit (INDEPENDENT_AMBULATORY_CARE_PROVIDER_SITE_OTHER): Payer: BC Managed Care – PPO | Admitting: Physician Assistant

## 2022-08-07 VITALS — BP 112/66 | HR 94 | Temp 97.2°F | Ht 65.0 in | Wt 254.4 lb

## 2022-08-07 DIAGNOSIS — J069 Acute upper respiratory infection, unspecified: Secondary | ICD-10-CM | POA: Diagnosis not present

## 2022-08-07 DIAGNOSIS — J40 Bronchitis, not specified as acute or chronic: Secondary | ICD-10-CM | POA: Diagnosis not present

## 2022-08-07 DIAGNOSIS — J111 Influenza due to unidentified influenza virus with other respiratory manifestations: Secondary | ICD-10-CM | POA: Diagnosis not present

## 2022-08-07 LAB — POC COVID19 BINAXNOW: SARS Coronavirus 2 Ag: NEGATIVE

## 2022-08-07 MED ORDER — HYDROCODONE BIT-HOMATROP MBR 5-1.5 MG/5ML PO SOLN: 5.0000 mL | Freq: Four times a day (QID) | ORAL | 0 refills | Status: DC | PRN

## 2022-08-07 MED ORDER — AZITHROMYCIN 250 MG PO TABS: ORAL_TABLET | ORAL | 0 refills | Status: AC

## 2022-08-07 NOTE — Progress Notes (Deleted)
Acute Office Visit  Subjective:    Patient ID: Alexandra [name], female    DOB: 05-29-1956, 66 y.o.   MRN: [id number]  No chief complaint on file.   HPI: Patient is in today for ***  Past Medical History:  Diagnosis Date   Gastro-esophageal reflux disease without esophagitis    Major depressive disorder, single episode, mild (HCC)    Mixed hyperlipidemia    Other cholangitis    Other fatigue    Other specified rheumatoid arthritis, multiple sites ([place])    Perioral dermatitis    Type 2 diabetes mellitus with other specified complication [facility])     Past Surgical History:  Procedure Laterality Date   ANTERIOR CERVICAL DECOMP/DISCECTOMY FUSION N/A 06/27/2020   Procedure: Cervical three-four Anterior cervical decompression/discectomy/fusion;  Surgeon: Erline Levine, MD;  Location: [place];  Service: Neurosurgery;  Laterality: N/A;   CERVICAL SPINE SURGERY  06/2020   CHOLECYSTECTOMY     ERCP      Family History  Problem Relation Age of Onset   Emphysema Mother    Cancer Father    Neuropathy Neg Hx     Social History   Socioeconomic History   Marital status: Single    Spouse name: Not on file   Number of children: Not on file   Years of education: Not on file   Highest education level: High school graduate  Occupational History   Not on file  Tobacco Use   Smoking status: Never   Smokeless tobacco: Never  Vaping Use   Vaping Use: Never used  Substance and Sexual Activity   Alcohol use: Never   Drug use: Never   Sexual activity: Not on file  Other Topics Concern   Not on file  Social History Narrative   Lives alone   Right handed   Caffeine: never   Social Determinants of Health   Financial Resource Strain: Not on file  Food Insecurity: Not on file  Transportation Needs: Not on file  Physical Activity: Not on file  Stress: Not on file  Social Connections: Not on file  Intimate Partner Violence: Not on file    Outpatient Medications Prior to Visit   Medication Sig Dispense Refill   ascorbic acid (VITAMIN C) 500 MG tablet Take 500 mg by mouth daily.     citalopram (CELEXA) 20 MG tablet Take 1 tablet (20 mg total) by mouth daily. 90 tablet 0   folic acid (FOLVITE) 1 MG tablet Take 1 mg by mouth daily.     metFORMIN (GLUCOPHAGE) 1000 MG tablet Take 1 tablet (1,000 mg total) by mouth 2 (two) times daily with a meal. 180 tablet 0   methotrexate (RHEUMATREX) 2.5 MG tablet Take 2.5 mg by mouth once a week. Take 10 tablets by mouth weekly     mometasone (ELOCON) 0.1 % cream APPLY TO AFFECTED AREA TWICE DAILY 15 g 2   montelukast (SINGULAIR) 10 MG tablet TAKE 1 TABLET BY MOUTH DAILY 90 tablet 3   predniSONE (DELTASONE) 5 MG tablet TAKE 1 TABLET BY MOUTH DAILY AS NEEDED 30 tablet 1   ramipril (ALTACE) 5 MG capsule Take 1 capsule (5 mg total) by mouth daily. 90 capsule 0   rosuvastatin (CRESTOR) 5 MG tablet TAKE ONE TABLET BY MOUTH DAILY 90 tablet 0   Semaglutide (RYBELSUS) 14 MG TABS Take 1 tablet (14 mg total) by mouth daily. 90 tablet 0   solifenacin (VESICARE) 5 MG tablet Take 1 tablet (5 mg total) by mouth daily. [place]  tablet 2   Vitamin D, Ergocalciferol, (DRISDOL) 1.25 MG (50000 UNIT) CAPS capsule Take 1 capsule (50,000 Units total) by mouth every 7 (seven) days. 5 capsule 5   No facility-administered medications prior to visit.    No Known Allergies  Review of Systems     Objective:    Physical Exam  There were no vitals taken for this visit. Wt Readings from Last 3 Encounters:  06/26/22 259 lb 6.4 oz (117.7 kg)  12/05/21 254 lb (115.2 kg)  04/17/21 263 lb (119.3 kg)    Health Maintenance Due  Topic Date Due   OPHTHALMOLOGY EXAM  Never done   PAP SMEAR-Modifier  Never done    There are no preventive care reminders to display for this patient.   Lab Results  Component Value Date   TSH 2.600 06/26/2022   Lab Results  Component Value Date   WBC 7.5 06/26/2022   HGB 13.1 06/26/2022   HCT 40.1 06/26/2022   MCV 94  06/26/2022   PLT 217 06/26/2022   Lab Results  Component Value Date   NA 141 06/26/2022   K 5.2 06/26/2022   CO2 24 06/26/2022   GLUCOSE 224 (H) 06/26/2022   BUN 22 06/26/2022   CREATININE 1.12 (H) 06/26/2022   BILITOT 0.4 06/26/2022   ALKPHOS 66 06/26/2022   AST 18 06/26/2022   ALT 23 06/26/2022   PROT 6.7 06/26/2022   ALBUMIN 4.0 06/26/2022   CALCIUM 9.9 06/26/2022   ANIONGAP 11 06/27/2020   EGFR 55 (L) 06/26/2022   Lab Results  Component Value Date   CHOL 139 06/26/2022   Lab Results  Component Value Date   HDL 35 (L) 06/26/2022   Lab Results  Component Value Date   LDLCALC 72 06/26/2022   Lab Results  Component Value Date   TRIG 192 (H) 06/26/2022   Lab Results  Component Value Date   CHOLHDL 4.0 06/26/2022   Lab Results  Component Value Date   HGBA1C 11.4 (H) 06/26/2022       Assessment & Plan:        Follow-up:  as needed  An After Visit Summary was printed and given to the patient.  Neil Crouch, DNP, [place] [phone]

## 2022-08-07 NOTE — Progress Notes (Signed)
Acute Office Visit  Subjective:    Patient ID: Alexandra [name], female    DOB: Dec 21, 1955, 66 y.o.   MRN: [id number]  Chief Complaint  Patient presents with   URI    HPI: Patient is in today for complaints of productive cough and malaise - states her symptoms started over the weekend with cough and congestion.  She never had any fever or body aches.  Went to urgent care and tested positive for flu and was prescribed tamiflu.  Cough has progressively worsened  Past Medical History:  Diagnosis Date   Gastro-esophageal reflux disease without esophagitis    Major depressive disorder, single episode, mild (HCC)    Mixed hyperlipidemia    Other cholangitis    Other fatigue    Other specified rheumatoid arthritis, multiple sites ([place])    Perioral dermatitis    Type 2 diabetes mellitus with other specified complication [facility])     Past Surgical History:  Procedure Laterality Date   ANTERIOR CERVICAL DECOMP/DISCECTOMY FUSION N/A 06/27/2020   Procedure: Cervical three-four Anterior cervical decompression/discectomy/fusion;  Surgeon: Erline Levine, MD;  Location: [place];  Service: Neurosurgery;  Laterality: N/A;   CERVICAL SPINE SURGERY  06/2020   CHOLECYSTECTOMY     ERCP      Family History  Problem Relation Age of Onset   Emphysema Mother    Cancer Father    Neuropathy Neg Hx     Social History   Socioeconomic History   Marital status: Single    Spouse name: Not on file   Number of children: Not on file   Years of education: Not on file   Highest education level: High school graduate  Occupational History   Not on file  Tobacco Use   Smoking status: Never   Smokeless tobacco: Never  Vaping Use   Vaping Use: Never used  Substance and Sexual Activity   Alcohol use: Never   Drug use: Never   Sexual activity: Not on file  Other Topics Concern   Not on file  Social History Narrative   Lives alone   Right handed   Caffeine: never   Social Determinants of Health    Financial Resource Strain: Not on file  Food Insecurity: Not on file  Transportation Needs: Not on file  Physical Activity: Not on file  Stress: Not on file  Social Connections: Not on file  Intimate Partner Violence: Not on file    Outpatient Medications Prior to Visit  Medication Sig Dispense Refill   ascorbic acid (VITAMIN C) 500 MG tablet Take 500 mg by mouth daily.     citalopram (CELEXA) 20 MG tablet Take 1 tablet (20 mg total) by mouth daily. 90 tablet 0   folic acid (FOLVITE) 1 MG tablet Take 1 mg by mouth daily.     metFORMIN (GLUCOPHAGE) 1000 MG tablet Take 1 tablet (1,000 mg total) by mouth 2 (two) times daily with a meal. 180 tablet 0   methotrexate (RHEUMATREX) 2.5 MG tablet Take 2.5 mg by mouth once a week. Take 10 tablets by mouth weekly     mometasone (ELOCON) 0.1 % cream APPLY TO AFFECTED AREA TWICE DAILY 15 g 2   montelukast (SINGULAIR) 10 MG tablet TAKE 1 TABLET BY MOUTH DAILY 90 tablet 3   oseltamivir (TAMIFLU) 75 MG capsule Take 75 mg by mouth 2 (two) times daily.     predniSONE (DELTASONE) 5 MG tablet TAKE 1 TABLET BY MOUTH DAILY AS NEEDED 30 tablet 1   promethazine-dextromethorphan (  PROMETHAZINE-DM) 6.25-15 MG/5ML syrup Take 5 mLs by mouth every 6 (six) hours.     ramipril (ALTACE) 5 MG capsule Take 1 capsule (5 mg total) by mouth daily. 90 capsule 0   rosuvastatin (CRESTOR) 5 MG tablet TAKE ONE TABLET BY MOUTH DAILY 90 tablet 0   Semaglutide (RYBELSUS) 14 MG TABS Take 1 tablet (14 mg total) by mouth daily. 90 tablet 0   solifenacin (VESICARE) 5 MG tablet Take 1 tablet (5 mg total) by mouth daily. 30 tablet 2   Vitamin D, Ergocalciferol, (DRISDOL) 1.25 MG (50000 UNIT) CAPS capsule Take 1 capsule (50,000 Units total) by mouth every 7 (seven) days. 5 capsule 5   No facility-administered medications prior to visit.    No Known Allergies  Review of Systems CONSTITUTIONAL: has had mild malaise E/N/T:see HPI CARDIOVASCULAR: Negative for chest  pain, RESPIRATORY: see HPI GASTROINTESTINAL: Negative for abdominal pain, acid reflux symptoms, constipation, diarrhea, nausea and vomiting.       Objective:    PHYSICAL EXAM:   VS: BP 112/66 (BP Location: Left Arm, Patient Position: Sitting, Cuff Size: Large)   Pulse 94   Temp (!) 97.2 F (36.2 C) (Temporal)   Ht _0  (1.651 m)   Wt 254 lb 6.4 oz (115.4 kg)   SpO2 97%   BMI 42.33 kg/m   GEN: Well nourished, well developed, in no acute distress  HEENT: normal external ears and nose - normal external auditory canals and TMS -  - Lips, Teeth and Gums - normal  Oropharynx -erythema/pnd Cardiac: RRR; no murmurs,  Respiratory:  scattered rhonchi - clears with coughing   Office Visit on 08/07/2022  Component Date Value Ref Range Status   SARS Coronavirus 2 Ag 08/07/2022 Negative  Negative Final     Health Maintenance Due  Topic Date Due   OPHTHALMOLOGY EXAM  Never done   PAP SMEAR-Modifier  Never done    There are no preventive care reminders to display for this patient.   Lab Results  Component Value Date   TSH 2.600 06/26/2022   Lab Results  Component Value Date   WBC 7.5 06/26/2022   HGB 13.1 06/26/2022   HCT 40.1 06/26/2022   MCV 94 06/26/2022   PLT 217 06/26/2022   Lab Results  Component Value Date   NA 141 06/26/2022   K 5.2 06/26/2022   CO2 24 06/26/2022   GLUCOSE 224 (H) 06/26/2022   BUN 22 06/26/2022   CREATININE 1.12 (H) 06/26/2022   BILITOT 0.4 06/26/2022   ALKPHOS 66 06/26/2022   AST 18 06/26/2022   ALT 23 06/26/2022   PROT 6.7 06/26/2022   ALBUMIN 4.0 06/26/2022   CALCIUM 9.9 06/26/2022   ANIONGAP 11 06/27/2020   EGFR 55 (L) 06/26/2022   Lab Results  Component Value Date   CHOL 139 06/26/2022   Lab Results  Component Value Date   HDL 35 (L) 06/26/2022   Lab Results  Component Value Date   LDLCALC 72 06/26/2022   Lab Results  Component Value Date   TRIG 192 (H) 06/26/2022   Lab Results  Component Value Date   CHOLHDL 4.0  06/26/2022   Lab Results  Component Value Date   HGBA1C 11.4 (H) 06/26/2022       Assessment & Plan:   Problem List Items Addressed This Visit   None Visit Diagnoses     Acute upper respiratory infection    -  Primary   Relevant Medications   oseltamivir (TAMIFLU) 75 MG  capsule - continue   azithromycin (ZITHROMAX) 250 MG tablet   Other Relevant Orders   POC COVID-19 BinaxNow (Completed)   Bronchitis       Relevant Medications   azithromycin (ZITHROMAX) 250 MG tablet   HYDROcodone bit-homatropine (HYDROMET) 5-1.5 MG/5ML syrup   Influenza       Relevant Medications   oseltamivir (TAMIFLU) 75 MG capsule   azithromycin (ZITHROMAX) 250 MG tablet      Meds ordered this encounter  Medications   azithromycin (ZITHROMAX) 250 MG tablet    Sig: Take 2 tablets on day 1, then 1 tablet daily on days 2 through 5    Dispense:  6 tablet    Refill:  0    Order Specific Question:   Supervising Provider    Answer:   Shelton Silvas   HYDROcodone bit-homatropine (HYDROMET) 5-1.5 MG/5ML syrup    Sig: Take 5 mLs by mouth every 6 (six) hours as needed.    Dispense:  120 mL    Refill:  0    Order Specific Question:   Supervising Provider    AnswerShelton Silvas    Orders Placed This Encounter  Procedures   POC COVID-19 BinaxNow     Follow-up: Return if symptoms worsen or fail to improve.  An After Visit Summary was printed and given to the patient.  Yetta Flock [facility] [phone]

## 2022-08-21 ENCOUNTER — Telehealth: Payer: Self-pay

## 2022-08-21 NOTE — Telephone Encounter (Signed)
Patient states she has completed the z-pak and cough medication which helped some but is still very congested and coughing, is pushing fluids and resting would like to know what else she can try or if something else can be sent in.

## 2022-08-22 ENCOUNTER — Ambulatory Visit (INDEPENDENT_AMBULATORY_CARE_PROVIDER_SITE_OTHER): Payer: BC Managed Care – PPO | Admitting: Physician Assistant

## 2022-08-22 ENCOUNTER — Encounter: Payer: Self-pay | Admitting: Physician Assistant

## 2022-08-22 DIAGNOSIS — J40 Bronchitis, not specified as acute or chronic: Secondary | ICD-10-CM

## 2022-08-22 MED ORDER — FLUCONAZOLE 150 MG PO TABS: 150.0000 mg | ORAL_TABLET | Freq: Every day | ORAL | 1 refills | Status: DC

## 2022-08-22 MED ORDER — TRIAMCINOLONE ACETONIDE 40 MG/ML IJ SUSP: 80.0000 mg | Freq: Once | INTRAMUSCULAR | Status: DC

## 2022-08-22 MED ORDER — AMOXICILLIN-POT CLAVULANATE 875-125 MG PO TABS: 1.0000 | ORAL_TABLET | Freq: Two times a day (BID) | ORAL | 0 refills | Status: DC

## 2022-08-22 MED ORDER — HYDROCODONE BIT-HOMATROP MBR 5-1.5 MG/5ML PO SOLN: 5.0000 mL | Freq: Four times a day (QID) | ORAL | 0 refills | Status: DC | PRN

## 2022-08-22 MED ORDER — PREDNISONE 20 MG PO TABS: 20.0000 mg | ORAL_TABLET | Freq: Two times a day (BID) | ORAL | 0 refills | Status: DC

## 2022-08-22 NOTE — Progress Notes (Signed)
Acute Office Visit  Subjective:    Patient ID: Alexandra [name], female    DOB: [birth year], 67 y.o.   MRN: [id number]  Chief Complaint  Patient presents with   Still cough and congestion    HPI: Patient is in today for continued cough and congestion over the past few weeks - she was treated for uri symptoms few weeks ago and seemed to get better but over past several days with more productive cough and wheezing  Denies fever but does have some malaise  Past Medical History:  Diagnosis Date   Gastro-esophageal reflux disease without esophagitis    Major depressive disorder, single episode, mild (HCC)    Mixed hyperlipidemia    Other cholangitis    Other fatigue    Other specified rheumatoid arthritis, multiple sites ([place])    Perioral dermatitis    Type 2 diabetes mellitus with other specified complication [facility])     Past Surgical History:  Procedure Laterality Date   ANTERIOR CERVICAL DECOMP/DISCECTOMY FUSION N/A 06/27/2020   Procedure: Cervical three-four Anterior cervical decompression/discectomy/fusion;  Surgeon: Erline Levine, MD;  Location: [place];  Service: Neurosurgery;  Laterality: N/A;   CERVICAL SPINE SURGERY  06/2020   CHOLECYSTECTOMY     ERCP      Family History  Problem Relation Age of Onset   Emphysema Mother    Cancer Father    Neuropathy Neg Hx     Social History   Socioeconomic History   Marital status: Single    Spouse name: Not on file   Number of children: Not on file   Years of education: Not on file   Highest education level: High school graduate  Occupational History   Not on file  Tobacco Use   Smoking status: Never   Smokeless tobacco: Never  Vaping Use   Vaping Use: Never used  Substance and Sexual Activity   Alcohol use: Never   Drug use: Never   Sexual activity: Not on file  Other Topics Concern   Not on file  Social History Narrative   Lives alone   Right handed   Caffeine: never   Social Determinants of Health   Financial  Resource Strain: Not on file  Food Insecurity: Not on file  Transportation Needs: Not on file  Physical Activity: Not on file  Stress: Not on file  Social Connections: Not on file  Intimate Partner Violence: Not on file    Outpatient Medications Prior to Visit  Medication Sig Dispense Refill   ascorbic acid (VITAMIN C) 500 MG tablet Take 500 mg by mouth daily.     citalopram (CELEXA) 20 MG tablet Take 1 tablet (20 mg total) by mouth daily. 90 tablet 0   folic acid (FOLVITE) 1 MG tablet Take 1 mg by mouth daily.     metFORMIN (GLUCOPHAGE) 1000 MG tablet Take 1 tablet (1,000 mg total) by mouth 2 (two) times daily with a meal. 180 tablet 0   methotrexate (RHEUMATREX) 2.5 MG tablet Take 2.5 mg by mouth once a week. Take 10 tablets by mouth weekly     mometasone (ELOCON) 0.1 % cream APPLY TO AFFECTED AREA TWICE DAILY 15 g 2   montelukast (SINGULAIR) 10 MG tablet TAKE 1 TABLET BY MOUTH DAILY 90 tablet 3   predniSONE (DELTASONE) 5 MG tablet TAKE 1 TABLET BY MOUTH DAILY AS NEEDED 30 tablet 1   ramipril (ALTACE) 5 MG capsule Take 1 capsule (5 mg total) by mouth daily. 90 capsule 0  rosuvastatin (CRESTOR) 5 MG tablet TAKE ONE TABLET BY MOUTH DAILY 90 tablet 0   Semaglutide (RYBELSUS) 14 MG TABS Take 1 tablet (14 mg total) by mouth daily. 90 tablet 0   solifenacin (VESICARE) 5 MG tablet Take 1 tablet (5 mg total) by mouth daily. 30 tablet 2   Vitamin D, Ergocalciferol, (DRISDOL) 1.25 MG (50000 UNIT) CAPS capsule Take 1 capsule (50,000 Units total) by mouth every 7 (seven) days. 5 capsule 5   HYDROcodone bit-homatropine (HYDROMET) 5-1.5 MG/5ML syrup Take 5 mLs by mouth every 6 (six) hours as needed. 120 mL 0   oseltamivir (TAMIFLU) 75 MG capsule Take 75 mg by mouth 2 (two) times daily.     promethazine-dextromethorphan (PROMETHAZINE-DM) 6.25-15 MG/5ML syrup Take 5 mLs by mouth every 6 (six) hours.     No facility-administered medications prior to visit.    No Known Allergies  Review of  Systems CONSTITUTIONAL: see HPI E/N/T: see HPI CARDIOVASCULAR: Negative for chest pain,  RESPIRATORY: see HPI GASTROINTESTINAL: Negative for abdominal pain, acid reflux symptoms, constipation, diarrhea, nausea and vomiting.          Objective:  PHYSICAL EXAM:   VS: BP 112/78 (BP Location: Left Arm, Patient Position: Sitting)   Pulse 88   Temp 97.7 F (36.5 C) (Temporal)   Ht _0  (1.651 m)   Wt 251 lb (113.9 kg)   SpO2 99%   BMI 41.77 kg/m   GEN: Well nourished, well developed, in no acute distress  HEENT: normal external ears and nose -- Lips, Teeth and Gums - normal  Oropharynx - erythema/pnd Cardiac: RRR; no murmurs, Respiratory:  scattered rhonchi/wheezes    Health Maintenance Due  Topic Date Due   OPHTHALMOLOGY EXAM  Never done   PAP SMEAR-Modifier  Never done    There are no preventive care reminders to display for this patient.   Lab Results  Component Value Date   TSH 2.600 06/26/2022   Lab Results  Component Value Date   WBC 7.5 06/26/2022   HGB 13.1 06/26/2022   HCT 40.1 06/26/2022   MCV 94 06/26/2022   PLT 217 06/26/2022   Lab Results  Component Value Date   NA 141 06/26/2022   K 5.2 06/26/2022   CO2 24 06/26/2022   GLUCOSE 224 (H) 06/26/2022   BUN 22 06/26/2022   CREATININE 1.12 (H) 06/26/2022   BILITOT 0.4 06/26/2022   ALKPHOS 66 06/26/2022   AST 18 06/26/2022   ALT 23 06/26/2022   PROT 6.7 06/26/2022   ALBUMIN 4.0 06/26/2022   CALCIUM 9.9 06/26/2022   ANIONGAP 11 06/27/2020   EGFR 55 (L) 06/26/2022   Lab Results  Component Value Date   CHOL 139 06/26/2022   Lab Results  Component Value Date   HDL 35 (L) 06/26/2022   Lab Results  Component Value Date   LDLCALC 72 06/26/2022   Lab Results  Component Value Date   TRIG 192 (H) 06/26/2022   Lab Results  Component Value Date   CHOLHDL 4.0 06/26/2022   Lab Results  Component Value Date   HGBA1C 11.4 (H) 06/26/2022       Assessment & Plan:   Problem List Items  Addressed This Visit   None Visit Diagnoses     Bronchitis       Relevant Medications   triamcinolone acetonide (KENALOG-40) injection 80 mg   predniSONE (DELTASONE) 20 MG tablet   amoxicillin-clavulanate (AUGMENTIN) 875-125 MG tablet   HYDROcodone bit-homatropine (HYDROMET) 5-1.5 MG/5ML syrup  Meds ordered this encounter  Medications   triamcinolone acetonide (KENALOG-40) injection 80 mg   predniSONE (DELTASONE) 20 MG tablet    Sig: Take 1 tablet (20 mg total) by mouth 2 (two) times daily with a meal.    Dispense:  20 tablet    Refill:  0    Order Specific Question:   Supervising Provider    Answer:   Marge Duncans [[id number]]   amoxicillin-clavulanate (AUGMENTIN) 875-125 MG tablet    Sig: Take 1 tablet by mouth 2 (two) times daily.    Dispense:  20 tablet    Refill:  0    Order Specific Question:   Supervising Provider    Answer:   Marge Duncans [[id number]]   fluconazole (DIFLUCAN) 150 MG tablet    Sig: Take 1 tablet (150 mg total) by mouth daily.    Dispense:  1 tablet    Refill:  1    Order Specific Question:   Supervising Provider    Answer:   Marge Duncans [[id number]]   HYDROcodone bit-homatropine (HYDROMET) 5-1.5 MG/5ML syrup    Sig: Take 5 mLs by mouth every 6 (six) hours as needed.    Dispense:  120 mL    Refill:  0    Order Specific Question:   Supervising Provider    Answer:   Marge Duncans [id number]    No orders of the defined types were placed in this encounter.    Follow-up: Return if symptoms worsen or fail to improve.  An After Visit Summary was printed and given to the patient.  Yetta Flock [facility] [phone]

## 2022-09-11 ENCOUNTER — Telehealth: Payer: Self-pay

## 2022-09-11 NOTE — Telephone Encounter (Signed)
I reached out to patient today to see if she has had a diabetic eye exam in the past year; she has not.  I asked how her blood sugars have been and she states that she does not check it.

## 2022-09-25 DIAGNOSIS — E119 Type 2 diabetes mellitus without complications: Secondary | ICD-10-CM | POA: Diagnosis not present

## 2022-09-25 LAB — HM DIABETES EYE EXAM

## 2022-09-27 ENCOUNTER — Encounter: Payer: Self-pay | Admitting: Physician Assistant

## 2022-10-03 ENCOUNTER — Other Ambulatory Visit: Payer: Self-pay | Admitting: Physician Assistant

## 2022-10-03 DIAGNOSIS — E119 Type 2 diabetes mellitus without complications: Secondary | ICD-10-CM

## 2022-10-09 ENCOUNTER — Other Ambulatory Visit: Payer: Self-pay | Admitting: Family Medicine

## 2022-10-10 ENCOUNTER — Encounter: Payer: Self-pay | Admitting: Gastroenterology

## 2022-10-10 ENCOUNTER — Ambulatory Visit (INDEPENDENT_AMBULATORY_CARE_PROVIDER_SITE_OTHER): Payer: BC Managed Care – PPO | Admitting: Physician Assistant

## 2022-10-10 ENCOUNTER — Encounter: Payer: Self-pay | Admitting: Physician Assistant

## 2022-10-10 VITALS — BP 120/64 | HR 80 | Temp 98.0°F | Resp 14 | Ht 65.0 in | Wt 243.0 lb

## 2022-10-10 DIAGNOSIS — E782 Mixed hyperlipidemia: Secondary | ICD-10-CM | POA: Diagnosis not present

## 2022-10-10 DIAGNOSIS — E559 Vitamin D deficiency, unspecified: Secondary | ICD-10-CM

## 2022-10-10 DIAGNOSIS — Z1231 Encounter for screening mammogram for malignant neoplasm of breast: Secondary | ICD-10-CM

## 2022-10-10 DIAGNOSIS — Z1211 Encounter for screening for malignant neoplasm of colon: Secondary | ICD-10-CM

## 2022-10-10 DIAGNOSIS — E119 Type 2 diabetes mellitus without complications: Secondary | ICD-10-CM

## 2022-10-10 DIAGNOSIS — Z0000 Encounter for general adult medical examination without abnormal findings: Secondary | ICD-10-CM

## 2022-10-10 DIAGNOSIS — B3731 Acute candidiasis of vulva and vagina: Secondary | ICD-10-CM

## 2022-10-10 DIAGNOSIS — L819 Disorder of pigmentation, unspecified: Secondary | ICD-10-CM

## 2022-10-10 DIAGNOSIS — I10 Essential (primary) hypertension: Secondary | ICD-10-CM | POA: Diagnosis not present

## 2022-10-10 LAB — POCT URINALYSIS DIP (CLINITEK)
Bilirubin, UA: NEGATIVE
Blood, UA: NEGATIVE
Glucose, UA: >=1000 mg/dL — AB
Ketones, POC UA: NEGATIVE mg/dL
Leukocytes, UA: NEGATIVE
Nitrite, UA: NEGATIVE
POC PROTEIN,UA: NEGATIVE
Spec Grav, UA: 1.015 (ref 1.010–1.025)
Urobilinogen, UA: 0.2 E.U./dL
pH, UA: 5 (ref 5.0–8.0)

## 2022-10-10 MED ORDER — FLUCONAZOLE 150 MG PO TABS: ORAL_TABLET | ORAL | 0 refills | Status: DC

## 2022-10-10 NOTE — Progress Notes (Signed)
Subjective:  Patient ID: Alexandra [name], female    DOB: 06/22/1956  Age: 67 y.o. MRN: [id number]  Chief Complaint  Patient presents with   Annual Exam   Hypertension   Diabetes    HPI Well Adult Physical: Patient here for a comprehensive physical exam.The patient reports  has vaginal itching --- has skin lesion on chest would like derm referral Do you take any herbs or supplements that were not prescribed by a doctor? no Are you taking calcium supplements? no Are you taking aspirin daily? no  Encounter for general adult medical examination without abnormal findings  Physical ("At Risk" items are starred): Patient's last pap was more than 10 years ago Patient is afflicted from Stress Incontinence and Urge Incontinence - uses vesicare Patient wears a seat belts Patient has smoke detectors and has carbon monoxide detectors. Patient wears sunscreen with extended sun exposure. Dental Care: , brushes and flosses daily.- due for visit Ophthalmology/Optometry: Annual visit.  Hearing loss: none Vision impairments: wears glasses  LMP: more than 10 years ago Safe at home: yes Self breast exams: yes     06/26/2022   10:50 AM 04/17/2021    9:54 AM 12/06/2020    8:37 AM  Depression screen PHQ 2/9  Decreased Interest 1 0 0  Down, Depressed, Hopeless 1 0 0  PHQ - 2 Score 2 0 0  Altered sleeping 0    Tired, decreased energy 0    Change in appetite 0    Feeling bad or failure about yourself  2    Trouble concentrating 0    Moving slowly or fidgety/restless 0    Suicidal thoughts 0    PHQ-9 Score 4    Difficult doing work/chores Not difficult at all           06/28/2020    8:00 PM 06/29/2020    8:00 AM 12/06/2020    8:37 AM 04/17/2021    9:54 AM 06/26/2022   10:50 AM  Benld in the past year?   1 0 0  Was there an injury with Fall?   0 0 0  Fall Risk Category Calculator   1 0 0  Fall Risk Category (Retired)   Low Low Low  (RETIRED) Patient Fall Risk Level Moderate fall risk  Moderate fall risk Low fall risk Low fall risk Low fall risk  Patient at Risk for Falls Due to   No Fall Risks No Fall Risks No Fall Risks  Fall risk Follow up   Falls evaluation completed Falls evaluation completed Falls evaluation completed             Social Hx   Social History   Socioeconomic History   Marital status: Single    Spouse name: Not on file   Number of children: Not on file   Years of education: Not on file   Highest education level: High school graduate  Occupational History   Not on file  Tobacco Use   Smoking status: Never   Smokeless tobacco: Never  Vaping Use   Vaping Use: Never used  Substance and Sexual Activity   Alcohol use: Not Currently   Drug use: Never   Sexual activity: Not Currently  Other Topics Concern   Not on file  Social History Narrative   Lives alone   Right handed   Caffeine: never   Social Determinants of Health   Financial Resource Strain: Not on file  Food Insecurity: Not on file  Transportation  Needs: Not on file  Physical Activity: Not on file  Stress: Not on file  Social Connections: Not on file   Past Medical History:  Diagnosis Date   Gastro-esophageal reflux disease without esophagitis    Major depressive disorder, single episode, mild (HCC)    Mixed hyperlipidemia    Other cholangitis    Other fatigue    Other specified rheumatoid arthritis, multiple sites ([place])    Perioral dermatitis    Type 2 diabetes mellitus with other specified complication [facility])    Past Surgical History:  Procedure Laterality Date   ANTERIOR CERVICAL DECOMP/DISCECTOMY FUSION N/A 06/27/2020   Procedure: Cervical three-four Anterior cervical decompression/discectomy/fusion;  Surgeon: Erline Levine, MD;  Location: [place];  Service: Neurosurgery;  Laterality: N/A;   CERVICAL SPINE SURGERY  06/2020   CHOLECYSTECTOMY     ERCP      Family History  Problem Relation Age of Onset   Emphysema Mother    Cancer Father    Neuropathy Neg Hx      Review of Systems CONSTITUTIONAL: Negative for chills, fatigue, fever, unintentional weight gain and unintentional weight loss.  E/N/T: Negative for ear pain, nasal congestion and sore throat.  CARDIOVASCULAR: Negative for chest pain, dizziness, palpitations and pedal edema.  RESPIRATORY: Negative for recent cough and dyspnea.  GASTROINTESTINAL: Negative for abdominal pain, acid reflux symptoms, constipation, diarrhea, nausea and vomiting.  GU - see HPI MSK: Negative for arthralgias and myalgias.  INTEGUMENTARY: see HPI NEUROLOGICAL: Negative for dizziness and headaches.  PSYCHIATRIC: Negative for sleep disturbance and to question depression screen.  Negative for depression, negative for anhedonia.       Objective:  PHYSICAL EXAM:   VS: BP 120/64   Pulse 80   Temp 98 F (36.7 C)   Resp 14   Ht 5' 5"$  (1.651 m)   Wt 243 lb (110.2 kg)   SpO2 99%   BMI 40.44 kg/m   GEN: Well nourished, well developed, in no acute distress  HEENT: normal external ears and nose - normal external auditory canals and TMS - hearing grossly normal -- Lips, Teeth and Gums - normal  Oropharynx - normal mucosa, palate, and posterior pharynx Neck: no JVD or masses - no thyromegaly Cardiac: RRR; no murmurs, rubs, or gallops,no edema - no significant varicosities Respiratory:  normal respiratory rate and pattern with no distress - normal breath sounds with no rales, rhonchi, wheezes or rubs Breasts - no masses GI: normal bowel sounds, no masses or tenderness GU - external genitalia shows yeast vaginitis - cervix normal - bimanual exam normal MS: no deformity or atrophy  Skin: warm and dry, no rash - raised/scaly papular lesion on upper anterior chest Neuro:  Alert and Oriented x 3, Strength and sensation are intact - CN II-Xii grossly intact Psych: euthymic mood, appropriate affect and demeanor    10/10/2022   10:57 AM 06/26/2022   10:50 AM 04/17/2021    9:54 AM 12/06/2020    8:37 AM  Depression  screen PHQ 2/9  Decreased Interest 0 1 0 0  Down, Depressed, Hopeless 1 1 0 0  PHQ - 2 Score 1 2 0 0  Altered sleeping 0 0    Tired, decreased energy 1 0    Change in appetite 0 0    Feeling bad or failure about yourself  3 2    Trouble concentrating 2 0    Moving slowly or fidgety/restless 0 0    Suicidal thoughts 0 0    PHQ-9  Score 7 4    Difficult doing work/chores Not difficult at all Not difficult at all      Office Visit on 10/10/2022  Component Date Value Ref Range Status   Color, UA 10/10/2022 yellow  yellow Final   Clarity, UA 10/10/2022 clear  clear Final   Glucose, UA 10/10/2022 >=1,000 (A)  negative mg/dL Final   Bilirubin, UA 10/10/2022 negative  negative Final   Ketones, POC UA 10/10/2022 negative  negative mg/dL Final   Spec Grav, UA 10/10/2022 1.015  1.010 - 1.025 Final   Blood, UA 10/10/2022 negative  negative Final   pH, UA 10/10/2022 5.0  5.0 - 8.0 Final   POC PROTEIN,UA 10/10/2022 negative  negative, trace Final   Urobilinogen, UA 10/10/2022 0.2  0.2 or 1.0 E.U./dL Final   Nitrite, UA 10/10/2022 Negative  Negative Final   Leukocytes, UA 10/10/2022 Negative  Negative Final     Lab Results  Component Value Date   WBC 7.5 06/26/2022   HGB 13.1 06/26/2022   HCT 40.1 06/26/2022   PLT 217 06/26/2022   GLUCOSE 224 (H) 06/26/2022   CHOL 139 06/26/2022   TRIG 192 (H) 06/26/2022   HDL 35 (L) 06/26/2022   LDLCALC 72 06/26/2022   ALT 23 06/26/2022   AST 18 06/26/2022   NA 141 06/26/2022   K 5.2 06/26/2022   CL 102 06/26/2022   CREATININE 1.12 (H) 06/26/2022   BUN 22 06/26/2022   CO2 24 06/26/2022   TSH 2.600 06/26/2022   HGBA1C 11.4 (H) 06/26/2022   MICROALBUR 150 02/01/2020      Assessment & Plan:  Alexandra [name] was seen today for annual exam, hypertension and diabetes.  Routine physical examination -     POCT URINALYSIS DIP (CLINITEK) -     CBC with Differential/Platelet -     Comprehensive metabolic panel -     TSH -     Lipid panel -      Hemoglobin A1c -     VITAMIN D 25 Hydroxy (Vit-D Deficiency, Fractures) -     IGP, Aptima HPV, rfx 16/18,45  Essential hypertension -     POCT URINALYSIS DIP (CLINITEK) -     TSH  Diabetes mellitus type II, non insulin dependent (HCC) -     Comprehensive metabolic panel -     TSH -     Hemoglobin A1c  Mixed hyperlipidemia -     Lipid panel  Vitamin D deficiency -     VITAMIN D 25 Hydroxy (Vit-D Deficiency, Fractures)  Atypical pigmented skin lesion -     Ambulatory referral to Dermatology  Encounter for screening mammogram for breast cancer -     Digital Screening Mammogram, Left and Right; Future  Screen for colon cancer -     Ambulatory referral to Gastroenterology      Body mass index is 40.44 kg/m.   These are the goals we discussed:  Goals   None      This is a list of the screening recommended for you and due dates:  Health Maintenance  Topic Date Due   Pap Smear  Never done   Colon Cancer Screening  12/06/2022*   Zoster (Shingles) Vaccine (1 of 2) 01/08/2023*   Mammogram  12/07/2022   Hemoglobin A1C  12/25/2022   Yearly kidney function blood test for diabetes  06/27/2023   Yearly kidney health urinalysis for diabetes  06/27/2023   Complete foot exam   06/27/2023   Eye exam for diabetics  09/26/2023   DTaP/Tdap/Td vaccine (2 - Td or Tdap) 07/02/2027   Flu Shot  Completed   HPV Vaccine  Aged Out   COVID-19 Vaccine  Discontinued   Hepatitis C Screening: USPSTF Recommendation to screen - Ages 18-79 yo.  Discontinued   HIV Screening  Discontinued  *Topic was postponed. The date shown is not the original due date.     No orders of the defined types were placed in this encounter.   Follow-up: Return in about 3 months (around 01/08/2023) for chronic fasting follow up.  An After Visit Summary was printed and given to the patient.  Yetta Flock [facility] [phone]

## 2022-10-11 LAB — CBC WITH DIFFERENTIAL/PLATELET
Basophils Absolute: 0.1 10*3/uL (ref 0.0–0.2)
Basos: 1 %
EOS (ABSOLUTE): 0.1 10*3/uL (ref 0.0–0.4)
Eos: 2 %
Hematocrit: 42.6 % (ref 34.0–46.6)
Hemoglobin: 13.9 g/dL (ref 11.1–15.9)
Immature Grans (Abs): 0 10*3/uL (ref 0.0–0.1)
Immature Granulocytes: 0 %
Lymphocytes Absolute: 1.6 10*3/uL (ref 0.7–3.1)
Lymphs: 21 %
MCH: 31.1 pg (ref 26.6–33.0)
MCHC: 32.6 g/dL (ref 31.5–35.7)
MCV: 95 fL (ref 79–97)
Monocytes Absolute: 0.6 10*3/uL (ref 0.1–0.9)
Monocytes: 8 %
Neutrophils Absolute: 5.2 10*3/uL (ref 1.4–7.0)
Neutrophils: 68 %
Platelets: 289 10*3/uL (ref 150–450)
RBC: 4.47 x10E6/uL (ref 3.77–5.28)
RDW: 13 % (ref 11.7–15.4)
WBC: 7.6 10*3/uL (ref 3.4–10.8)

## 2022-10-11 LAB — CARDIOVASCULAR RISK ASSESSMENT

## 2022-10-11 LAB — HEMOGLOBIN A1C
Est. average glucose Bld gHb Est-mCnc: 329 mg/dL
Hgb A1c MFr Bld: 13.1 % — ABNORMAL HIGH (ref 4.8–5.6)

## 2022-10-11 LAB — COMPREHENSIVE METABOLIC PANEL
ALT: 15 IU/L (ref 0–32)
AST: 13 IU/L (ref 0–40)
Albumin/Globulin Ratio: 1.7 (ref 1.2–2.2)
Albumin: 4.3 g/dL (ref 3.9–4.9)
Alkaline Phosphatase: 78 IU/L (ref 44–121)
BUN/Creatinine Ratio: 21 (ref 12–28)
BUN: 22 mg/dL (ref 8–27)
Bilirubin Total: 0.5 mg/dL (ref 0.0–1.2)
CO2: 23 mmol/L (ref 20–29)
Calcium: 10 mg/dL (ref 8.7–10.3)
Chloride: 98 mmol/L (ref 96–106)
Creatinine, Ser: 1.07 mg/dL — ABNORMAL HIGH (ref 0.57–1.00)
Globulin, Total: 2.5 g/dL (ref 1.5–4.5)
Glucose: 379 mg/dL — ABNORMAL HIGH (ref 70–99)
Potassium: 4.9 mmol/L (ref 3.5–5.2)
Sodium: 136 mmol/L (ref 134–144)
Total Protein: 6.8 g/dL (ref 6.0–8.5)
eGFR: 58 mL/min/{1.73_m2} — ABNORMAL LOW (ref >59)

## 2022-10-11 LAB — LIPID PANEL
Chol/HDL Ratio: 4.8 ratio — ABNORMAL HIGH (ref 0.0–4.4)
Cholesterol, Total: 198 mg/dL (ref 100–199)
HDL: 41 mg/dL (ref >39)
LDL Chol Calc (NIH): 111 mg/dL — ABNORMAL HIGH (ref 0–99)
Triglycerides: 264 mg/dL — ABNORMAL HIGH (ref 0–149)
VLDL Cholesterol Cal: 46 mg/dL — ABNORMAL HIGH (ref 5–40)

## 2022-10-11 LAB — TSH: TSH: 1.92 u[IU]/mL (ref 0.450–4.500)

## 2022-10-11 LAB — VITAMIN D 25 HYDROXY (VIT D DEFICIENCY, FRACTURES): Vit D, 25-Hydroxy: 31.1 ng/mL (ref 30.0–100.0)

## 2022-10-17 LAB — IGP, APTIMA HPV, RFX 16/18,45
HPV Aptima: NEGATIVE
PAP Smear Comment: 0

## 2022-10-21 ENCOUNTER — Other Ambulatory Visit: Payer: Self-pay | Admitting: Physician Assistant

## 2022-10-27 ENCOUNTER — Other Ambulatory Visit: Payer: Self-pay | Admitting: Physician Assistant

## 2022-10-31 ENCOUNTER — Telehealth: Payer: Self-pay

## 2022-10-31 DIAGNOSIS — L578 Other skin changes due to chronic exposure to nonionizing radiation: Secondary | ICD-10-CM | POA: Diagnosis not present

## 2022-10-31 DIAGNOSIS — L814 Other melanin hyperpigmentation: Secondary | ICD-10-CM | POA: Diagnosis not present

## 2022-10-31 DIAGNOSIS — C44529 Squamous cell carcinoma of skin of other part of trunk: Secondary | ICD-10-CM | POA: Diagnosis not present

## 2022-10-31 NOTE — Telephone Encounter (Signed)
Patient called and stated that she still can't get her rybelsus due to the copay card hack that the pharmacy's are currently dealing with and without the copay card applied to her prescription the cost is not affordable. Consulted with Marge Duncans and per her provider we can give the patient samples and all we have are the '3mg'$  so patient is to only take 2 tablets of the '3mg'$  daily (total of 6 mg) until she can get her prescription again with the copay card. Patient informed.

## 2022-11-11 DIAGNOSIS — C44529 Squamous cell carcinoma of skin of other part of trunk: Secondary | ICD-10-CM | POA: Diagnosis not present

## 2022-11-12 ENCOUNTER — Telehealth: Payer: Self-pay

## 2022-11-12 NOTE — Telephone Encounter (Signed)
Patient has called stating that the pharmacy still can't use the savings card for her rybelsus. She states that she is running out of samples after the ones that we gave her the last time and told her to only taking 2 tablets of the rybelsus 3mg  of samples that we gave her. She is wanting to know do you want to switch her at this time to something else or what do we need to do? She states without the savings card it will cost her 269$.

## 2022-11-12 NOTE — Telephone Encounter (Signed)
Patient informed of this and patient did not state which she would rather do and just said ok thanks and hung up.

## 2022-11-25 ENCOUNTER — Telehealth: Payer: Self-pay | Admitting: *Deleted

## 2022-11-25 ENCOUNTER — Ambulatory Visit: Payer: BC Managed Care – PPO

## 2022-11-25 NOTE — Progress Notes (Unsigned)
Pt's pre-visit is done over the phone and all paperwork (prep instructions) sent to patient. Pt's name and DOB verified at the beginning of the pre-visit. Pt denies any difficulty with ambulating.  No egg or soy allergy known to patient  No issues known to pt with past sedation with any surgeries or procedures Pt denies having issues being intubated Patient denies ever being intubated Pt has no issues moving head neck or swallowing No FH of Malignant Hyperthermia Pt is not on diet pills Pt is not on home 02  Pt is not on blood thinners  Pt denies issues with constipation  Pt has frequent issues with constipation RN instructed pt to use Miralax per bottles instructions a week before prep days. Pt states they will Pt is not on dialysis Pt denies any upcoming cardiac testing Pt encouraged to use to use Singlecare or Goodrx to reduce cost  Patient's chart reviewed by Alexandra [name] CNRA prior to pre-visit and patient appropriate for the [facility].  Pre-visit completed and red dot placed by patient's name on their procedure day (on provider's schedule).  . Visit by phone Pt states  weight is  Instructions reviewed with pt and pt states understanding. Instructed to review again prior to procedure. Pt states they will.  Instructions sent by mail with coupon and by my chart 

## 2022-11-25 NOTE — Telephone Encounter (Signed)
Called pt for pre-visit.Pt stated she has too many personal issues and requested to cancel procedure and pre-visit. She stated that she will reschedule as soon as she can.

## 2022-11-28 ENCOUNTER — Other Ambulatory Visit: Payer: Self-pay | Admitting: Physician Assistant

## 2022-11-28 DIAGNOSIS — E119 Type 2 diabetes mellitus without complications: Secondary | ICD-10-CM

## 2022-11-28 MED ORDER — RYBELSUS 14 MG PO TABS
1.0000 | ORAL_TABLET | Freq: Every day | ORAL | 0 refills | Status: DC
Start: 2022-11-28 — End: 2022-12-02

## 2022-11-28 NOTE — Telephone Encounter (Signed)
I spoke with the drug rep Alexandra [name] - he states that the savings card issue has been fixed 1-2 weeks ago and the coupon is through a whole new vendor at this point - he states to try to run again and perhaps even try another pharmacy There should not be an issue with the coupon applying  Pt notified

## 2022-11-28 NOTE — Telephone Encounter (Signed)
Patient has called back stating that she called her insurance and they can't give her a medication that would be the same as the rybelsus or in the same class that will be covered. She said they were absolutely no help. Please advise.

## 2022-12-02 ENCOUNTER — Other Ambulatory Visit: Payer: Self-pay | Admitting: Physician Assistant

## 2022-12-02 DIAGNOSIS — E119 Type 2 diabetes mellitus without complications: Secondary | ICD-10-CM

## 2022-12-02 MED ORDER — RYBELSUS 14 MG PO TABS
1.0000 | ORAL_TABLET | Freq: Every day | ORAL | 2 refills | Status: DC
Start: 2022-12-02 — End: 2023-03-25

## 2022-12-02 NOTE — Telephone Encounter (Signed)
Alexandra [name] called and said that her savings card still won't go thru.  She asked if you can send her RX to [facility] [place] to see if it will work there.

## 2022-12-02 NOTE — Telephone Encounter (Signed)
sent 

## 2022-12-05 ENCOUNTER — Telehealth: Payer: Self-pay

## 2022-12-05 NOTE — Telephone Encounter (Signed)
Patient called and stated that her pharmacy if=s still unable to use the coupon for her rybelsus, wanted to know what she needs to do, she has been calling and has been told that she will get a call back but has not heard from anybody. Please advsie

## 2022-12-06 NOTE — Telephone Encounter (Signed)
Called patient she stated that provider stop both the [place]. Patient stated that she did not know that [facility] she never got a call back, she is going to call there and see if they will take the discount card and she will call back if not so we can send in the actos.

## 2022-12-09 ENCOUNTER — Other Ambulatory Visit: Payer: Self-pay

## 2022-12-09 DIAGNOSIS — E119 Type 2 diabetes mellitus without complications: Secondary | ICD-10-CM

## 2022-12-09 DIAGNOSIS — E782 Mixed hyperlipidemia: Secondary | ICD-10-CM

## 2022-12-09 MED ORDER — RAMIPRIL 5 MG PO CAPS
5.0000 mg | ORAL_CAPSULE | Freq: Every day | ORAL | 0 refills | Status: DC
Start: 2022-12-09 — End: 2023-03-25

## 2022-12-09 MED ORDER — ROSUVASTATIN CALCIUM 5 MG PO TABS
5.0000 mg | ORAL_TABLET | Freq: Every day | ORAL | 0 refills | Status: DC
Start: 2022-12-09 — End: 2023-03-25

## 2022-12-23 ENCOUNTER — Encounter: Payer: BC Managed Care – PPO | Admitting: Gastroenterology

## 2022-12-24 ENCOUNTER — Other Ambulatory Visit: Payer: Self-pay | Admitting: Physician Assistant

## 2022-12-24 ENCOUNTER — Telehealth: Payer: Self-pay

## 2022-12-24 MED ORDER — FLUCONAZOLE 150 MG PO TABS: 150.0000 mg | ORAL_TABLET | Freq: Every day | ORAL | 1 refills | Status: DC

## 2022-12-24 NOTE — Telephone Encounter (Signed)
Patient called and stated that she ha got another yeast infection and wanted to know if you could call her something in for it please. Please advise

## 2022-12-24 NOTE — Telephone Encounter (Signed)
Yes will send in rx

## 2022-12-24 NOTE — Telephone Encounter (Signed)
Patient Made Aware, Verbalized Understanding. 

## 2022-12-25 ENCOUNTER — Ambulatory Visit
Admission: RE | Admit: 2022-12-25 | Discharge: 2022-12-25 | Disposition: A | Discharge status: Home / Self Care | Payer: BC Managed Care – PPO | Source: Ambulatory Visit | Attending: Physician Assistant | Admitting: Physician Assistant

## 2022-12-25 DIAGNOSIS — Z1231 Encounter for screening mammogram for malignant neoplasm of breast: Secondary | ICD-10-CM

## 2022-12-31 DIAGNOSIS — Z79899 Other long term (current) drug therapy: Secondary | ICD-10-CM | POA: Diagnosis not present

## 2022-12-31 DIAGNOSIS — R7989 Other specified abnormal findings of blood chemistry: Secondary | ICD-10-CM | POA: Diagnosis not present

## 2022-12-31 DIAGNOSIS — M1991 Primary osteoarthritis, unspecified site: Secondary | ICD-10-CM | POA: Diagnosis not present

## 2022-12-31 DIAGNOSIS — M0579 Rheumatoid arthritis with rheumatoid factor of multiple sites without organ or systems involvement: Secondary | ICD-10-CM | POA: Diagnosis not present

## 2023-01-01 ENCOUNTER — Encounter: Payer: Self-pay | Admitting: Internal Medicine

## 2023-01-01 LAB — LAB REPORT - SCANNED: EGFR: 58

## 2023-01-14 ENCOUNTER — Ambulatory Visit: Payer: BC Managed Care – PPO | Admitting: Physician Assistant

## 2023-02-10 ENCOUNTER — Other Ambulatory Visit: Payer: Self-pay | Admitting: Physician Assistant

## 2023-02-19 ENCOUNTER — Other Ambulatory Visit: Payer: Self-pay | Admitting: Physician Assistant

## 2023-02-19 DIAGNOSIS — E119 Type 2 diabetes mellitus without complications: Secondary | ICD-10-CM

## 2023-03-11 ENCOUNTER — Other Ambulatory Visit: Payer: Self-pay | Admitting: Physician Assistant

## 2023-03-11 DIAGNOSIS — D2271 Melanocytic nevi of right lower limb, including hip: Secondary | ICD-10-CM | POA: Diagnosis not present

## 2023-03-11 DIAGNOSIS — C44529 Squamous cell carcinoma of skin of other part of trunk: Secondary | ICD-10-CM | POA: Diagnosis not present

## 2023-03-11 DIAGNOSIS — L570 Actinic keratosis: Secondary | ICD-10-CM | POA: Diagnosis not present

## 2023-03-19 ENCOUNTER — Ambulatory Visit: Payer: BC Managed Care – PPO | Admitting: Physician Assistant

## 2023-03-19 ENCOUNTER — Encounter: Payer: Self-pay | Admitting: Physician Assistant

## 2023-03-19 VITALS — BP 112/62 | HR 79 | Temp 97.3°F | Ht 65.0 in | Wt 255.8 lb

## 2023-03-19 DIAGNOSIS — I10 Essential (primary) hypertension: Secondary | ICD-10-CM

## 2023-03-19 DIAGNOSIS — Z1211 Encounter for screening for malignant neoplasm of colon: Secondary | ICD-10-CM

## 2023-03-19 DIAGNOSIS — F419 Anxiety disorder, unspecified: Secondary | ICD-10-CM

## 2023-03-19 DIAGNOSIS — R110 Nausea: Secondary | ICD-10-CM

## 2023-03-19 DIAGNOSIS — E119 Type 2 diabetes mellitus without complications: Secondary | ICD-10-CM | POA: Diagnosis not present

## 2023-03-19 DIAGNOSIS — E559 Vitamin D deficiency, unspecified: Secondary | ICD-10-CM | POA: Diagnosis not present

## 2023-03-19 DIAGNOSIS — M069 Rheumatoid arthritis, unspecified: Secondary | ICD-10-CM

## 2023-03-19 DIAGNOSIS — R140 Abdominal distension (gaseous): Secondary | ICD-10-CM

## 2023-03-19 DIAGNOSIS — E782 Mixed hyperlipidemia: Secondary | ICD-10-CM | POA: Diagnosis not present

## 2023-03-19 MED ORDER — CITALOPRAM HYDROBROMIDE 20 MG PO TABS: ORAL_TABLET | ORAL | 5 refills | Status: DC

## 2023-03-19 MED ORDER — MONTELUKAST SODIUM 10 MG PO TABS: 10.0000 mg | ORAL_TABLET | Freq: Every day | ORAL | 3 refills | Status: DC

## 2023-03-20 ENCOUNTER — Other Ambulatory Visit: Payer: Self-pay | Admitting: Physician Assistant

## 2023-03-20 ENCOUNTER — Encounter: Payer: Self-pay | Admitting: Physician Assistant

## 2023-03-20 DIAGNOSIS — R1084 Generalized abdominal pain: Secondary | ICD-10-CM

## 2023-03-20 DIAGNOSIS — R630 Anorexia: Secondary | ICD-10-CM

## 2023-03-20 DIAGNOSIS — E119 Type 2 diabetes mellitus without complications: Secondary | ICD-10-CM

## 2023-03-20 DIAGNOSIS — R110 Nausea: Secondary | ICD-10-CM

## 2023-03-20 MED ORDER — PIOGLITAZONE HCL 15 MG PO TABS: 15.0000 mg | ORAL_TABLET | Freq: Every day | ORAL | 2 refills | Status: DC

## 2023-03-20 NOTE — Progress Notes (Signed)
Established Patient Office Visit  Subjective:  Patient ID: Alexandra [name], female    DOB: 1956/01/23  Age: 67 y.o. MRN: [id number]  CC:  Chief Complaint  Patient presents with   Diabetes follow up   Pt last visit 4/23 HPI Alexandra [name] presents for follow up diabetes Pt states that she has not been checking her glucose at all -  She is taking glucophage 1000mg  , altace 5mg , crestor 5mg  and rybelsus 14mg  She did get eye exam in February  Pt with history of hyperlipidemia - pt states she is taking crestor 5mg  qd - trying to watch diet  Pt with history of RA - she sees Dr Dierdre Forth who has her on methotrexate and prednisone - she states at this time her symptoms are stable however she feels the methotrexate is causing nausea  Pt on Celexa 20mg  qd for anxiety -she thinks she would benefit from increased dose - is having some breakthrough anxiety  Pt with history of Vit D def - is on weekly supplement and due for labwork  Pt states that for the past month she has noted nausea most all the time.  She denies vomiting.  She has had generalized abdominal pain, cramping and gas.  She has noted decreased appetite.  Is not having any changes in bowel movements.  Is due for a screening colonoscopy and agreeable for GI referral   Past Medical History:  Diagnosis Date   Gastro-esophageal reflux disease without esophagitis    Major depressive disorder, single episode, mild (HCC)    Mixed hyperlipidemia    Other cholangitis    Other fatigue    Other specified rheumatoid arthritis, multiple sites (HCC)    Perioral dermatitis    Type 2 diabetes mellitus with other specified complication [facility])     Past Surgical History:  Procedure Laterality Date   ANTERIOR CERVICAL DECOMP/DISCECTOMY FUSION N/A 06/27/2020   Procedure: Cervical three-four Anterior cervical decompression/discectomy/fusion;  Surgeon: Maeola Harman, MD;  Location: [facility];  Service: Neurosurgery;  Laterality: N/A;   CERVICAL SPINE SURGERY   06/2020   CHOLECYSTECTOMY     ERCP      Family History  Problem Relation Age of Onset   Emphysema Mother    Cancer Father    Neuropathy Neg Hx     Social History   Socioeconomic History   Marital status: Single    Spouse name: Not on file   Number of children: Not on file   Years of education: Not on file   Highest education level: High school graduate  Occupational History   Not on file  Tobacco Use   Smoking status: Never   Smokeless tobacco: Never  Vaping Use   Vaping status: Never Used  Substance and Sexual Activity   Alcohol use: Not Currently   Drug use: Never   Sexual activity: Not Currently  Other Topics Concern   Not on file  Social History Narrative   Lives alone   Right handed   Caffeine: never   Social Determinants of Health   Financial Resource Strain: Low Risk  (03/19/2023)   Overall Financial Resource Strain (CARDIA)    Difficulty of Paying Living Expenses: Not hard at all  Food Insecurity: No Food Insecurity (03/19/2023)   Hunger Vital Sign    Worried About Running Out of Food in the Last Year: Never true    Ran Out of Food in the Last Year: Never true  Transportation Needs: No Transportation Needs (03/19/2023)   PRAPARE -  Administrator, Civil Service (Medical): No    Lack of Transportation (Non-Medical): No  Physical Activity: Inactive (03/19/2023)   Exercise Vital Sign    Days of Exercise per Week: 0 days    Minutes of Exercise per Session: 0 min  Stress: No Stress Concern Present (03/19/2023)   [employer] of Occupational Health - Occupational Stress Questionnaire    Feeling of Stress : Not at all  Social Connections: Socially Isolated (03/19/2023)   Social Connection and Isolation Panel [NHANES]    Frequency of Communication with Friends and Family: More than three times a week    Frequency of Social Gatherings with Friends and Family: More than three times a week    Attends Religious Services: Never    Doctor, general practice or Organizations: No    Attends Banker Meetings: Never    Marital Status: Never married  Intimate Partner Violence: Not At Risk (03/19/2023)   Humiliation, Afraid, Rape, and Kick questionnaire    Fear of Current or Ex-Partner: No    Emotionally Abused: No    Physically Abused: No    Sexually Abused: No     Current Outpatient Medications:    ascorbic acid (VITAMIN C) 500 MG tablet, Take 500 mg by mouth daily., Disp: , Rfl:    citalopram (CELEXA) 20 MG tablet, 1 and 1/2 po qd, Disp: 45 tablet, Rfl: 5   folic acid (FOLVITE) 1 MG tablet, Take 1 mg by mouth daily., Disp: , Rfl:    metFORMIN (GLUCOPHAGE) 1000 MG tablet, TAKE ONE TABLET BY MOUTH TWICE DAILY WITH A MEAL, Disp: 180 tablet, Rfl: 0   methotrexate (RHEUMATREX) 2.5 MG tablet, Take 2.5 mg by mouth once a week. Take 10 tablets by mouth weekly, Disp: , Rfl:    mometasone (ELOCON) 0.1 % cream, APPLY TO AFFECTED AREA TWICE DAILY, Disp: 15 g, Rfl: 2   predniSONE (DELTASONE) 5 MG tablet, TAKE 1 TABLET BY MOUTH DAILY AS NEEDED, Disp: 30 tablet, Rfl: 1   ramipril (ALTACE) 5 MG capsule, Take 1 capsule (5 mg total) by mouth daily., Disp: 90 capsule, Rfl: 0   rosuvastatin (CRESTOR) 5 MG tablet, Take 1 tablet (5 mg total) by mouth daily., Disp: 90 tablet, Rfl: 0   Semaglutide (RYBELSUS) 14 MG TABS, Take 1 tablet (14 mg total) by mouth daily., Disp: 30 tablet, Rfl: 2   solifenacin (VESICARE) 5 MG tablet, TAKE 1 TABLET BY MOUTH DAILY, Disp: 30 tablet, Rfl: 2   Vitamin D, Ergocalciferol, 50000 units CAPS, TAKE 1 CAPSULE BY MOUTH EVERY 7 DAYS, Disp: 5 capsule, Rfl: 5   montelukast (SINGULAIR) 10 MG tablet, Take 1 tablet (10 mg total) by mouth daily., Disp: 90 tablet, Rfl: 3   pioglitazone (ACTOS) 15 MG tablet, Take 1 tablet (15 mg total) by mouth daily., Disp: 30 tablet, Rfl: 2   No Known Allergies  CONSTITUTIONAL: Negative for chills, fatigue, fever, unintentional weight gain and unintentional weight loss.  E/N/T: Negative for  ear pain, nasal congestion and sore throat.  CARDIOVASCULAR: Negative for chest pain, dizziness, palpitations and pedal edema.  RESPIRATORY: Negative for recent cough and dyspnea.  GASTROINTESTINAL: see HPI INTEGUMENTARY: Negative for rash.  NEUROLOGICAL: Negative for dizziness and headaches.  PSYCHIATRIC: see HPI        Objective: PHYSICAL EXAM:   VS: BP 112/62 (BP Location: Left Arm, Patient Position: Sitting, Cuff Size: Large)   Pulse 79   Temp (!) 97.3 F (36.3 C) (Temporal)   Ht  5\' 5"  (1.651 m)   Wt 255 lb 12.8 oz (116 kg)   SpO2 97%   BMI 42.57 kg/m   GEN: Well nourished, well developed, in no acute distress   Cardiac: RRR; no murmurs, rubs, or gallops,no edema -  Respiratory:  normal respiratory rate and pattern with no distress - normal breath sounds with no rales, rhonchi, wheezes or rubs GI: normal bowel sounds, no masses - generalized tenderness MS: no deformity or atrophy  Skin: warm and dry, no rash  Neuro:  Alert and Oriented x 3, Strength and sensation are intact - CN II-Xii grossly intact Psych: euthymic mood, appropriate affect and demeanor    Health Maintenance Due  Topic Date Due   Zoster Vaccines- Shingrix (1 of 2) Never done   Colonoscopy  Never done   INFLUENZA VACCINE  03/20/2023    There are no preventive care reminders to display for this patient.  Lab Results  Component Value Date   TSH 2.920 03/19/2023   Lab Results  Component Value Date   WBC 8.1 03/19/2023   HGB 11.9 03/19/2023   HCT 35.4 03/19/2023   MCV 94 03/19/2023   PLT 244 03/19/2023   Lab Results  Component Value Date   NA 139 03/19/2023   K 5.1 03/19/2023   CO2 22 03/19/2023   GLUCOSE 161 (H) 03/19/2023   BUN 26 03/19/2023   CREATININE 1.15 (H) 03/19/2023   BILITOT 0.5 03/19/2023   ALKPHOS 65 03/19/2023   AST 14 03/19/2023   ALT 16 03/19/2023   PROT 6.8 03/19/2023   ALBUMIN 4.1 03/19/2023   CALCIUM 9.7 03/19/2023   ANIONGAP 11 06/27/2020   EGFR 53 (L)  03/19/2023   Lab Results  Component Value Date   CHOL 133 03/19/2023   Lab Results  Component Value Date   HDL 37 (L) 03/19/2023   Lab Results  Component Value Date   LDLCALC 65 03/19/2023   Lab Results  Component Value Date   TRIG 188 (H) 03/19/2023   Lab Results  Component Value Date   CHOLHDL 3.6 03/19/2023   Lab Results  Component Value Date   HGBA1C 10.1 (H) 03/19/2023      Assessment & Plan:   Problem List Items Addressed This Visit     Endocrine   Diabetes mellitus type II, non insulin dependent (HCC)   Relevant Orders   Hemoglobin A1c Cbc Cmp Continue current meds and efforts at weight loss Watch diet         Anxiety with depression Increase citalopram to 30mg  qd  Nausea with generalized abdominal pain Labwork pending Recommend GI referral --- also due for colonoscopy     Other   Mixed hyperlipidemia   Relevant Orders   Lipid panel Watch diet Continue crestor 5mg  qd         Vit D def Continue supplement Labwork pending    Other Visit Diagnoses     Rheumatoid arthritis involving multiple sites, unspecified whether rheumatoid factor present (HCC)       Relevant Orders   Continue meds and follow up with rheumatology as directed                Meds ordered this encounter  Medications   citalopram (CELEXA) 20 MG tablet    Sig: 1 and 1/2 po qd    Dispense:  45 tablet    Refill:  5    Order Specific Question:   Supervising Provider    Answer:   Marianne Sofia [  [id number]]   montelukast (SINGULAIR) 10 MG tablet    Sig: Take 1 tablet (10 mg total) by mouth daily.    Dispense:  90 tablet    Refill:  3    Order Specific Question:   Supervising Provider    Answer:   Marianne Sofia [id number]    Follow-up: Return in about 3 months (around 06/19/2023) for chronic fasting follow-up.

## 2023-03-25 ENCOUNTER — Other Ambulatory Visit: Payer: Self-pay | Admitting: Physician Assistant

## 2023-03-25 ENCOUNTER — Other Ambulatory Visit: Payer: Self-pay

## 2023-03-25 DIAGNOSIS — R630 Anorexia: Secondary | ICD-10-CM

## 2023-03-25 DIAGNOSIS — R1084 Generalized abdominal pain: Secondary | ICD-10-CM | POA: Diagnosis not present

## 2023-03-25 DIAGNOSIS — R110 Nausea: Secondary | ICD-10-CM | POA: Diagnosis not present

## 2023-03-25 DIAGNOSIS — I728 Aneurysm of other specified arteries: Secondary | ICD-10-CM | POA: Diagnosis not present

## 2023-03-25 DIAGNOSIS — K861 Other chronic pancreatitis: Secondary | ICD-10-CM | POA: Diagnosis not present

## 2023-03-25 DIAGNOSIS — R109 Unspecified abdominal pain: Secondary | ICD-10-CM | POA: Diagnosis not present

## 2023-03-25 DIAGNOSIS — E119 Type 2 diabetes mellitus without complications: Secondary | ICD-10-CM

## 2023-03-25 DIAGNOSIS — K8689 Other specified diseases of pancreas: Secondary | ICD-10-CM | POA: Diagnosis not present

## 2023-03-25 DIAGNOSIS — E782 Mixed hyperlipidemia: Secondary | ICD-10-CM

## 2023-03-28 ENCOUNTER — Telehealth: Payer: Self-pay

## 2023-03-28 NOTE — Telephone Encounter (Signed)
No - I have not gotten results yet

## 2023-03-28 NOTE — Telephone Encounter (Signed)
Notify CT abdomen was scanned in chart but not sent to me or to results  It shows no acute findings - it does show chronic pancreatitis with no acute infection and no mass ---- she has been referred to GI and will need to discuss treatment for this condition with them

## 2023-03-28 NOTE — Telephone Encounter (Signed)
Patient called and wanted to know if the provider has received her CT she had done on Tuesday.

## 2023-03-28 NOTE — Telephone Encounter (Signed)
Patient Made Aware, Verbalized Understanding. 

## 2023-03-31 NOTE — Telephone Encounter (Signed)
Patient called and wanted to get her referral switched to Dr. Chales Abrahams. She wanted to let the office know that she would like to hold on the colonoscopy until she find out what to do about the chronic pancreatitis found in her recent CT.  Informed Amber of this and referral was switched, Patient made aware referral was sent to Dr. Chales Abrahams office. Also recommended patient let them know that she wants to talk to him about the chronic pancreatitis, before doing the colonoscopy, when they call her for an appointment.

## 2023-04-15 DIAGNOSIS — M1991 Primary osteoarthritis, unspecified site: Secondary | ICD-10-CM | POA: Diagnosis not present

## 2023-04-15 DIAGNOSIS — M0579 Rheumatoid arthritis with rheumatoid factor of multiple sites without organ or systems involvement: Secondary | ICD-10-CM | POA: Diagnosis not present

## 2023-04-15 DIAGNOSIS — R7989 Other specified abnormal findings of blood chemistry: Secondary | ICD-10-CM | POA: Diagnosis not present

## 2023-04-15 DIAGNOSIS — Z79899 Other long term (current) drug therapy: Secondary | ICD-10-CM | POA: Diagnosis not present

## 2023-05-20 ENCOUNTER — Other Ambulatory Visit: Payer: Self-pay | Admitting: Physician Assistant

## 2023-05-20 DIAGNOSIS — E119 Type 2 diabetes mellitus without complications: Secondary | ICD-10-CM

## 2023-06-26 ENCOUNTER — Ambulatory Visit: Payer: BC Managed Care – PPO | Admitting: Gastroenterology

## 2023-06-26 ENCOUNTER — Other Ambulatory Visit (INDEPENDENT_AMBULATORY_CARE_PROVIDER_SITE_OTHER): Payer: BC Managed Care – PPO

## 2023-06-26 ENCOUNTER — Ambulatory Visit: Payer: BC Managed Care – PPO | Admitting: Physician Assistant

## 2023-06-26 ENCOUNTER — Encounter: Payer: Self-pay | Admitting: Gastroenterology

## 2023-06-26 VITALS — BP 124/70 | HR 83 | Ht 65.0 in | Wt 251.0 lb

## 2023-06-26 DIAGNOSIS — R110 Nausea: Secondary | ICD-10-CM

## 2023-06-26 DIAGNOSIS — Z1211 Encounter for screening for malignant neoplasm of colon: Secondary | ICD-10-CM

## 2023-06-26 DIAGNOSIS — R6881 Early satiety: Secondary | ICD-10-CM

## 2023-06-26 DIAGNOSIS — R9389 Abnormal findings on diagnostic imaging of other specified body structures: Secondary | ICD-10-CM

## 2023-06-26 DIAGNOSIS — K861 Other chronic pancreatitis: Secondary | ICD-10-CM

## 2023-06-26 DIAGNOSIS — Z1212 Encounter for screening for malignant neoplasm of rectum: Secondary | ICD-10-CM

## 2023-06-26 LAB — CBC WITH DIFFERENTIAL/PLATELET
Basophils Absolute: 0.1 10*3/uL (ref 0.0–0.1)
Basophils Relative: 0.8 % (ref 0.0–3.0)
Eosinophils Absolute: 0.3 10*3/uL (ref 0.0–0.7)
Eosinophils Relative: 4.3 % (ref 0.0–5.0)
HCT: 35 % — ABNORMAL LOW (ref 36.0–46.0)
Hemoglobin: 11.6 g/dL — ABNORMAL LOW (ref 12.0–15.0)
Lymphocytes Relative: 32 % (ref 12.0–46.0)
Lymphs Abs: 2 10*3/uL (ref 0.7–4.0)
MCHC: 33.2 g/dL (ref 30.0–36.0)
MCV: 95.3 fL (ref 78.0–100.0)
Monocytes Absolute: 0.8 10*3/uL (ref 0.1–1.0)
Monocytes Relative: 12.2 % — ABNORMAL HIGH (ref 3.0–12.0)
Neutro Abs: 3.1 10*3/uL (ref 1.4–7.7)
Neutrophils Relative %: 50.7 % (ref 43.0–77.0)
Platelets: 268 10*3/uL (ref 150.0–400.0)
RBC: 3.67 Mil/uL — ABNORMAL LOW (ref 3.87–5.11)
RDW: 14.8 % (ref 11.5–15.5)
WBC: 6.2 10*3/uL (ref 4.0–10.5)

## 2023-06-26 LAB — COMPREHENSIVE METABOLIC PANEL
ALT: 38 U/L — ABNORMAL HIGH (ref 0–35)
AST: 22 U/L (ref 0–37)
Albumin: 3.9 g/dL (ref 3.5–5.2)
Alkaline Phosphatase: 52 U/L (ref 39–117)
BUN: 18 mg/dL (ref 6–23)
CO2: 29 meq/L (ref 19–32)
Calcium: 9.5 mg/dL (ref 8.4–10.5)
Chloride: 104 meq/L (ref 96–112)
Creatinine, Ser: 1.13 mg/dL (ref 0.40–1.20)
GFR: 51.26 mL/min — ABNORMAL LOW (ref >60.00)
Glucose, Bld: 135 mg/dL — ABNORMAL HIGH (ref 70–99)
Potassium: 4 meq/L (ref 3.5–5.1)
Sodium: 139 meq/L (ref 135–145)
Total Bilirubin: 0.4 mg/dL (ref 0.2–1.2)
Total Protein: 7 g/dL (ref 6.0–8.3)

## 2023-06-26 LAB — AMYLASE: Amylase: 39 U/L (ref 27–131)

## 2023-06-26 LAB — LIPASE: Lipase: 86 U/L — ABNORMAL HIGH (ref 11.0–59.0)

## 2023-06-26 MED ORDER — ONDANSETRON 4 MG PO TBDP: 4.0000 mg | ORAL_TABLET | Freq: Four times a day (QID) | ORAL | 0 refills | Status: DC | PRN

## 2023-06-26 MED ORDER — PANTOPRAZOLE SODIUM 40 MG PO TBEC: 40.0000 mg | DELAYED_RELEASE_TABLET | Freq: Every day | ORAL | 4 refills | Status: DC

## 2023-06-26 NOTE — Patient Instructions (Addendum)
_______________________________________________________  If your blood pressure at your visit was 140/90 or greater, please contact your primary care physician to follow up on this.  _______________________________________________________  If you are age 65 or older, your body mass index should be between 23-30. Your Body mass index is 41.77 kg/m. If this is out of the aforementioned range listed, please consider follow up with your Primary Care Provider.  If you are age 71 or younger, your body mass index should be between 19-25. Your Body mass index is 41.77 kg/m. If this is out of the aformentioned range listed, please consider follow up with your Primary Care Provider.   ________________________________________________________  The [facility] providers would like to encourage you to use [facility] to communicate with providers for non-urgent requests or questions.  Due to long hold times on the telephone, sending your provider a message by [facility] may be a faster and more efficient way to get a response.  Please allow 48 business hours for a response.  Please remember that this is for non-urgent requests.  _______________________________________________________  Your provider has requested that you go to the basement level for lab work before leaving today. Press "B" on the elevator. The lab is located at the first door on the left as you exit the elevator.  We have sent the following medications to your pharmacy for you to pick up at your convenience: Protonix Zofran  Please call Dr. Donne Hazel nurse  in 2 weeks at [phone]  to let her now how you are doing.  You have been scheduled for an appointment with Dr. Chales Abrahams on 09-29-2023 at 3:40pm. Please arrive 10 minutes early for your appointment.   You have been scheduled for an MRI at [facility] on 07-04-2023. Your appointment time is 4pm. Please arrive to admitting (at main entrance of the hospital) 30 minutes prior to your  appointment time for registration purposes. Please make certain not to have anything to eat or drink 4 hours prior to your test. In addition, if you have any metal in your body, have a pacemaker or defibrillator, please be sure to let your ordering physician know. This test typically takes 45 minutes to 1 hour to complete. Should you need to reschedule, please call [phone] to do so.  Your provider has ordered Cologuard testing as an option for colon cancer screening. This is performed by [employer] and may be out of network with your insurance. PRIOR to completing the test, it is YOUR responsibility to contact your insurance about covered benefits for this test. Your out of pocket expense could be anywhere from $0.00 to $649.00.   When you call to check coverage with your insurer, please provide the following information:   -The ONLY provider of Cologuard is [facility], dispensing  - CPT code for Cologuard is [phone].  [facility] NPI # [id number]  -[employer] Tax ID # [id number]   We have already sent your demographic and insurance information to [employer] (phone number [phone]) and they should contact you within the next week regarding your test. If you have not heard from them within the next week, please call our office at [phone].  Thank you,  Dr. Lynann Bologna

## 2023-06-26 NOTE — Progress Notes (Signed)
Chief Complaint: GI problems.  Referring Provider:  Marianne Sofia, PA-C      ASSESSMENT AND PLAN;   #1. Nausea with ?early satiety.  To some extent related to methotrexate.  #2. Chronic pancreatitis on CT with intraductal calculi and parenchymal calcification.  No PD dil, no masses. No previous history of pancreatitis.  #3. CRC screening  Plan: -CBC, CMP, amy/lip, IgG4, celiac -Trial of protonix 40mg  po every day #30, 4RF -Zofran 4mg  ODT Q6hrs prn #20 -MRCP with contrast -Offered EGD-she politely refused. -Cologuard.  -FU in 12 weeks. At FU, recheck labs including CA 19-9, CEA.    HPI:    Alexandra [name] is a 67 y.o. female  RA on methotrexate, DM2, HLD, anxiety/depression, GERD  C/O nausea, early satiety x 3 months.  Does have more nausea when she takes methotrexate every Wednesday.  The dose has been reduced with resultant some improvement in nausea.  No vomiting.  She denies having any odynophagia, dysphagia, regurgitation.  Does complain of a lot of "gas" and burping. NO ABDO PAIN.  Denies having any diarrhea, constipation.  No recent weight loss.   Evaluated by Gerre Pebbles Mildly elevated lipase, with normal LFTs, normal CBC Underwent CT Abdo/pelvis with contrast 03/25/2023 showing distal pancreatic atrophy with calcifications and several intraductal calculi.  These findings were consistent with chronic pancreatitis.  No evidence of acute pancreatitis.  No peripancreatic fluid collections.  No pancreatic or biliary ductal dilatation.  Patient has undergone lap chole with IOC 09/2017 for symptomatic cholelithiasis Per history she may have undergone ERCP prior-awaiting records.  No ETOH No family history of pancreatitis. No history of trauma No medications except statins Had normal calcium and triglyceride level less than 1000. No history of intake of herbal medications or previous history of pancreatitis.    Wt Readings from Last 3 Encounters:  06/26/23 251 lb (113.9  kg)  03/19/23 255 lb 12.8 oz (116 kg)  10/10/22 243 lb (110.2 kg)    Past GI workup: Colon- never-she has been offered several times for screening colonoscopy.  She refused every time.  Even was scheduled for colon but she canceled. Today willing to do Cologuard EGD- remote, >20 yrs ago.  Patient does not recall as to where. Lap chole with IOC 09/2017 ? ERCP 2019 at [facility]  CT Abdo/pelvis with contrast 03/25/2023 -distal pancreatic atrophy with calcifications and several intraductal calculi.  These findings were consistent with chronic pancreatitis.  No evidence of acute pancreatitis.  No peripancreatic fluid collections.  No pancreatic or biliary ductal dilatation. -No masses.  SH-single, no children, office worker, no smoking.  No alcohol. Past Medical History:  Diagnosis Date   Gastro-esophageal reflux disease without esophagitis    Major depressive disorder, single episode, mild (HCC)    Mixed hyperlipidemia    Other cholangitis    Other fatigue    Other specified rheumatoid arthritis, multiple sites (HCC)    Perioral dermatitis    Type 2 diabetes mellitus with other specified complication [facility])     Past Surgical History:  Procedure Laterality Date   ANTERIOR CERVICAL DECOMP/DISCECTOMY FUSION N/A 06/27/2020   Procedure: Cervical three-four Anterior cervical decompression/discectomy/fusion;  Surgeon: Maeola Harman, MD;  Location: [facility] OR;  Service: Neurosurgery;  Laterality: N/A;   CERVICAL SPINE SURGERY  06/2020   CHOLECYSTECTOMY     ERCP      Family History  Problem Relation Age of Onset   Emphysema Mother    Diabetes Mother    Liver cancer Father  Neuropathy Neg Hx    Colon cancer Neg Hx    Esophageal cancer Neg Hx    Stomach cancer Neg Hx     Social History   Tobacco Use   Smoking status: Never   Smokeless tobacco: Never  Vaping Use   Vaping status: Never Used  Substance Use Topics   Alcohol use: Not Currently   Drug use: Never    Current  Outpatient Medications  Medication Sig Dispense Refill   ascorbic acid (VITAMIN C) 500 MG tablet Take 500 mg by mouth daily.     citalopram (CELEXA) 20 MG tablet 1 and 1/2 po qd 45 tablet 5   folic acid (FOLVITE) 1 MG tablet Take 1 mg by mouth daily.     metFORMIN (GLUCOPHAGE) 1000 MG tablet TAKE ONE TABLET BY MOUTH TWICE DAILY WITH A MEAL 180 tablet 0   methotrexate (RHEUMATREX) 2.5 MG tablet Take 2.5 mg by mouth once a week. Take 10 tablets by mouth weekly     mometasone (ELOCON) 0.1 % cream APPLY TO AFFECTED AREA TWICE DAILY 15 g 2   montelukast (SINGULAIR) 10 MG tablet Take 1 tablet (10 mg total) by mouth daily. 90 tablet 3   pioglitazone (ACTOS) 15 MG tablet Take 1 tablet (15 mg total) by mouth daily. 30 tablet 2   predniSONE (DELTASONE) 5 MG tablet TAKE 1 TABLET BY MOUTH DAILY AS NEEDED 30 tablet 1   ramipril (ALTACE) 5 MG capsule Take 1 capsule (5 mg total) by mouth daily. 90 capsule 0   rosuvastatin (CRESTOR) 5 MG tablet Take 1 tablet (5 mg total) by mouth daily. 90 tablet 0   RYBELSUS 14 MG TABS TAKE 1 TABLET (14 MG TOTAL) BY MOUTH DAILY 30 tablet 2   solifenacin (VESICARE) 5 MG tablet TAKE 1 TABLET BY MOUTH DAILY 30 tablet 2   Vitamin D, Ergocalciferol, 50000 units CAPS TAKE 1 CAPSULE BY MOUTH EVERY 7 DAYS 5 capsule 5   No current facility-administered medications for this visit.    No Known Allergies  Review of Systems:  Constitutional: Denies fever, chills, diaphoresis, appetite change and fatigue.  HEENT: Denies photophobia, eye pain, redness, hearing loss, ear pain, congestion, sore throat, rhinorrhea, sneezing, mouth sores, neck pain, neck stiffness and tinnitus.   Respiratory: Denies SOB, DOE, cough, chest tightness,  and wheezing.   Cardiovascular: Denies chest pain, palpitations and leg swelling.  Genitourinary: Denies dysuria, urgency, frequency, hematuria, flank pain and difficulty urinating.  Musculoskeletal: Denies myalgias, back pain, joint swelling, arthralgias and  gait problem.  Skin: No rash.  Neurological: Denies dizziness, seizures, syncope, weakness, light-headedness, numbness and headaches.  Hematological: Denies adenopathy. Easy bruising, personal or family bleeding history  Psychiatric/Behavioral: No anxiety or depression     Physical Exam:    BP 124/70   Pulse 83   Ht 5\' 5"  (1.651 m)   Wt 251 lb (113.9 kg)   BMI 41.77 kg/m  Wt Readings from Last 3 Encounters:  06/26/23 251 lb (113.9 kg)  03/19/23 255 lb 12.8 oz (116 kg)  10/10/22 243 lb (110.2 kg)   Constitutional:  Well-developed, in no acute distress. Psychiatric: Normal mood and affect. Behavior is normal. HEENT: Pupils normal.  Conjunctivae are normal. No scleral icterus. Cardiovascular: Normal rate, regular rhythm. No edema Pulmonary/chest: Effort normal and breath sounds normal. No wheezing, rales or rhonchi. Abdominal: Soft, nondistended. Nontender. Bowel sounds active throughout. There are no masses palpable. No hepatomegaly. Rectal: Deferred Neurological: Alert and oriented to person place and time. Skin:  Skin is warm and dry. No rashes noted.  Data Reviewed: I have personally reviewed following labs and imaging studies  CBC:    Latest Ref Rng & Units 03/19/2023   10:57 AM 10/10/2022   11:01 AM 06/26/2022   11:18 AM  CBC  WBC 3.4 - 10.8 x10E3/uL 8.1  7.6  7.5   Hemoglobin 11.1 - 15.9 g/dL 96.2  95.2  84.1   Hematocrit 34.0 - 46.6 % 35.4  42.6  40.1   Platelets 150 - 450 x10E3/uL 244  289  217     CMP:    Latest Ref Rng & Units 03/19/2023   10:57 AM 10/10/2022   11:01 AM 06/26/2022   11:18 AM  CMP  Glucose 70 - 99 mg/dL 324  401  027   BUN 8 - 27 mg/dL 26  22  22    Creatinine 0.57 - 1.00 mg/dL 2.53  6.64  4.03   Sodium 134 - 144 mmol/L 139  136  141   Potassium 3.5 - 5.2 mmol/L 5.1  4.9  5.2   Chloride 96 - 106 mmol/L 103  98  102   CO2 20 - 29 mmol/L 22  23  24    Calcium 8.7 - 10.3 mg/dL 9.7  47.4  9.9   Total Protein 6.0 - 8.5 g/dL 6.8  6.8  6.7   Total  Bilirubin 0.0 - 1.2 mg/dL 0.5  0.5  0.4   Alkaline Phos 44 - 121 IU/L 65  78  66   AST 0 - 40 IU/L 14  13  18    ALT 0 - 32 IU/L 16  15  23         Edman Circle, MD 06/26/2023, 3:30 PM  Cc: Marianne Sofia, PA-C

## 2023-06-29 ENCOUNTER — Other Ambulatory Visit: Payer: Self-pay | Admitting: Physician Assistant

## 2023-06-30 ENCOUNTER — Telehealth: Payer: Self-pay

## 2023-06-30 NOTE — Telephone Encounter (Signed)
Patient is requesting something to relax her for her MRI exam. Please advise it is on the 15th

## 2023-07-01 ENCOUNTER — Other Ambulatory Visit: Payer: Self-pay | Admitting: Physician Assistant

## 2023-07-01 DIAGNOSIS — E119 Type 2 diabetes mellitus without complications: Secondary | ICD-10-CM

## 2023-07-02 MED ORDER — ALPRAZOLAM 0.5 MG PO TABS: 0.5000 mg | ORAL_TABLET | ORAL | 0 refills | Status: AC

## 2023-07-02 NOTE — Telephone Encounter (Signed)
Patient stated that the medication that was given to her daily is causing diarrhea. She doesn't know the name of it she says. We gave her Protonix when she was last seen. Please advise    Xanax 0.5 take 1 tablet 2 hours before procedure and then right before procedure if needed faxed to pharmacy

## 2023-07-03 NOTE — Telephone Encounter (Signed)
Lets hold off on Protonix for now RG

## 2023-07-03 NOTE — Telephone Encounter (Signed)
Told patient she can stop the Protonix and asked me what is was and I told her that Protonix will help to control the acid reflux produced in the stomach so that it doesn't try to come up and she asked about the Zofran and it to help with feeling of nauseous and she she said she doesn't understand that he don't understand but she said she will call when her 2 weeks ends because she as told to call in 2 weeks from her appointment for to let us know how she is

## 2023-07-04 ENCOUNTER — Ambulatory Visit (HOSPITAL_COMMUNITY): Payer: BC Managed Care – PPO

## 2023-07-04 LAB — CELIAC PANEL 10
Antigliadin Abs, IgA: 4 U (ref 0–19)
Endomysial IgA: NEGATIVE
Gliadin IgG: 2 U (ref 0–19)
Tissue Transglut Ab: 4 U/mL (ref 0–5)
Transglutaminase IgA: <2 U/mL (ref 0–3)

## 2023-07-04 LAB — IGG 4: IgG, Subclass 4: 23 mg/dL (ref 2–96)

## 2023-07-06 ENCOUNTER — Other Ambulatory Visit: Payer: Self-pay | Admitting: Physician Assistant

## 2023-07-06 DIAGNOSIS — E119 Type 2 diabetes mellitus without complications: Secondary | ICD-10-CM

## 2023-07-07 ENCOUNTER — Other Ambulatory Visit: Payer: Self-pay | Admitting: Physician Assistant

## 2023-07-21 ENCOUNTER — Ambulatory Visit: Payer: BC Managed Care – PPO | Admitting: Physician Assistant

## 2023-07-21 ENCOUNTER — Encounter: Payer: Self-pay | Admitting: Physician Assistant

## 2023-07-21 VITALS — BP 102/68 | HR 67 | Temp 97.8°F | Ht 65.0 in | Wt 241.0 lb

## 2023-07-21 DIAGNOSIS — R899 Unspecified abnormal finding in specimens from other organs, systems and tissues: Secondary | ICD-10-CM

## 2023-07-21 DIAGNOSIS — E1169 Type 2 diabetes mellitus with other specified complication: Secondary | ICD-10-CM | POA: Diagnosis not present

## 2023-07-21 DIAGNOSIS — M069 Rheumatoid arthritis, unspecified: Secondary | ICD-10-CM

## 2023-07-21 DIAGNOSIS — I10 Essential (primary) hypertension: Secondary | ICD-10-CM | POA: Diagnosis not present

## 2023-07-21 DIAGNOSIS — E559 Vitamin D deficiency, unspecified: Secondary | ICD-10-CM

## 2023-07-21 DIAGNOSIS — F419 Anxiety disorder, unspecified: Secondary | ICD-10-CM

## 2023-07-21 DIAGNOSIS — E119 Type 2 diabetes mellitus without complications: Secondary | ICD-10-CM | POA: Diagnosis not present

## 2023-07-21 DIAGNOSIS — E785 Hyperlipidemia, unspecified: Secondary | ICD-10-CM

## 2023-07-21 DIAGNOSIS — E782 Mixed hyperlipidemia: Secondary | ICD-10-CM | POA: Diagnosis not present

## 2023-07-21 DIAGNOSIS — Z23 Encounter for immunization: Secondary | ICD-10-CM | POA: Diagnosis not present

## 2023-07-21 MED ORDER — BLOOD GLUCOSE TEST VI STRP: 1.0000 | ORAL_STRIP | Freq: Three times a day (TID) | 0 refills | Status: AC

## 2023-07-21 MED ORDER — BLOOD GLUCOSE MONITORING SUPPL DEVI: 1.0000 | Freq: Three times a day (TID) | 0 refills | Status: AC

## 2023-07-21 MED ORDER — LANCETS MISC. MISC: 1.0000 | Freq: Three times a day (TID) | 0 refills | Status: AC

## 2023-07-21 MED ORDER — LANCET DEVICE MISC: 1.0000 | Freq: Three times a day (TID) | 0 refills | Status: AC

## 2023-07-21 NOTE — Progress Notes (Addendum)
Established Patient Office Visit  Subjective:  Patient ID: Alexandra [name], female    DOB: [birth year]  Age: 67 y.o. MRN: [id number]  CC:  Chief Complaint  Patient presents with   Diabetes follow up   Pt last visit in July HPI Alexandra [name] presents for follow up diabetes Pt states that she has not been checking her glucose at all - does agree to get glucometer and supplies sent in and will try to start checking She is taking glucophage 1000mg  bid , altace 5mg , crestor 5mg  actos 15mg  and rybelsus 14mg  - she has not wanted to start insulin - last A1c was over 10 She did get eye exam in February  Pt with history of hyperlipidemia - pt states she is taking crestor 5mg  qd - trying to watch diet Is due for labwork  Pt with history of RA - she sees Dr Dierdre Forth who has her on methotrexate and prednisone - she states at this time her symptoms are stable however she feels the methotrexate is causing nausea  Pt on Celexa 20mg  1 and 1/2 po qd  for anxiety -says she is doing well on this medication - also uses xanax as needed  Pt with history of Vit D def - is on weekly supplement and due for labwork  Pt is currently following with Dr Chales Abrahams for nausea/pancreatitis - she is scheduled for MRI in near future and has follow up with him after testing  Pt would like flu shot today  Past Medical History:  Diagnosis Date   Gastro-esophageal reflux disease without esophagitis    Major depressive disorder, single episode, mild (HCC)    Mixed hyperlipidemia    Other cholangitis    Other fatigue    Other specified rheumatoid arthritis, multiple sites (HCC)    Perioral dermatitis    Type 2 diabetes mellitus with other specified complication [facility])     Past Surgical History:  Procedure Laterality Date   ANTERIOR CERVICAL DECOMP/DISCECTOMY FUSION N/A 06/27/2020   Procedure: Cervical three-four Anterior cervical decompression/discectomy/fusion;  Surgeon: Maeola Harman, MD;  Location: [facility] OR;  Service:  Neurosurgery;  Laterality: N/A;   CERVICAL SPINE SURGERY  06/2020   CHOLECYSTECTOMY     ERCP      Family History  Problem Relation Age of Onset   Emphysema Mother    Diabetes Mother    Liver cancer Father    Neuropathy Neg Hx    Colon cancer Neg Hx    Esophageal cancer Neg Hx    Stomach cancer Neg Hx     Social History   Socioeconomic History   Marital status: Single    Spouse name: Not on file   Number of children: Not on file   Years of education: Not on file   Highest education level: High school graduate  Occupational History   Occupation: Freight forwarder  Tobacco Use   Smoking status: Never   Smokeless tobacco: Never  Vaping Use   Vaping status: Never Used  Substance and Sexual Activity   Alcohol use: Not Currently   Drug use: Never   Sexual activity: Not Currently  Other Topics Concern   Not on file  Social History Narrative   Lives alone   Right handed   Caffeine: never   Social Determinants of Health   Financial Resource Strain: Low Risk  (03/19/2023)   Overall Financial Resource Strain (CARDIA)    Difficulty of Paying Living Expenses: Not hard at all  Food Insecurity: No Food Insecurity (03/19/2023)  Hunger Vital Sign    Worried About Running Out of Food in the Last Year: Never true    Ran Out of Food in the Last Year: Never true  Transportation Needs: No Transportation Needs (03/19/2023)   PRAPARE - Administrator, Civil Service (Medical): No    Lack of Transportation (Non-Medical): No  Physical Activity: Inactive (03/19/2023)   Exercise Vital Sign    Days of Exercise per Week: 0 days    Minutes of Exercise per Session: 0 min  Stress: No Stress Concern Present (03/19/2023)   [employer] of Occupational Health - Occupational Stress Questionnaire    Feeling of Stress : Not at all  Social Connections: Socially Isolated (03/19/2023)   Social Connection and Isolation Panel [NHANES]    Frequency of Communication with Friends and Family:  More than three times a week    Frequency of Social Gatherings with Friends and Family: More than three times a week    Attends Religious Services: Never    Database administrator or Organizations: No    Attends Banker Meetings: Never    Marital Status: Never married  Intimate Partner Violence: Not At Risk (03/19/2023)   Humiliation, Afraid, Rape, and Kick questionnaire    Fear of Current or Ex-Partner: No    Emotionally Abused: No    Physically Abused: No    Sexually Abused: No     Current Outpatient Medications:    ALPRAZolam (XANAX) 0.5 MG tablet, Take 1 tablet (0.5 mg total) by mouth as directed. Take 1 tablet 2 hours before procedure. If still take next pill right before procedure, Disp: 2 tablet, Rfl: 0   ascorbic acid (VITAMIN C) 500 MG tablet, Take 500 mg by mouth daily., Disp: , Rfl:    Blood Glucose Monitoring Suppl DEVI, 1 each by Does not apply route in the morning, at noon, and at bedtime. May substitute to any manufacturer covered by patient's insurance., Disp: 1 each, Rfl: 0   citalopram (CELEXA) 20 MG tablet, 1 and 1/2 po qd, Disp: 45 tablet, Rfl: 5   folic acid (FOLVITE) 1 MG tablet, Take 1 mg by mouth daily., Disp: , Rfl:    Glucose Blood (BLOOD GLUCOSE TEST STRIPS) STRP, 1 each by In Vitro route in the morning, at noon, and at bedtime. May substitute to any manufacturer covered by patient's insurance., Disp: 100 strip, Rfl: 0   Lancet Device MISC, 1 each by Does not apply route in the morning, at noon, and at bedtime. May substitute to any manufacturer covered by patient's insurance., Disp: 1 each, Rfl: 0   Lancets Misc. MISC, 1 each by Does not apply route in the morning, at noon, and at bedtime. May substitute to any manufacturer covered by patient's insurance., Disp: 100 each, Rfl: 0   metFORMIN (GLUCOPHAGE) 1000 MG tablet, TAKE ONE TABLET BY MOUTH TWICE DAILY WITH A MEAL, Disp: 180 tablet, Rfl: 0   methotrexate (RHEUMATREX) 2.5 MG tablet, Take 2.5 mg by  mouth once a week. Take 10 tablets by mouth weekly, Disp: , Rfl:    mometasone (ELOCON) 0.1 % cream, APPLY TO AFFECTED AREA TWICE DAILY, Disp: 15 g, Rfl: 2   montelukast (SINGULAIR) 10 MG tablet, Take 1 tablet (10 mg total) by mouth daily., Disp: 90 tablet, Rfl: 3   ondansetron (ZOFRAN-ODT) 4 MG disintegrating tablet, Take 1 tablet (4 mg total) by mouth every 6 (six) hours as needed for nausea or vomiting., Disp: 20 tablet, Rfl: 0  pioglitazone (ACTOS) 15 MG tablet, take ONE tablet by MOUTH daily, Disp: 30 tablet, Rfl: 2   predniSONE (DELTASONE) 5 MG tablet, TAKE 1 TABLET BY MOUTH DAILY AS NEEDED, Disp: 30 tablet, Rfl: 1   ramipril (ALTACE) 5 MG capsule, Take 1 capsule (5 mg total) by mouth daily., Disp: 90 capsule, Rfl: 0   rosuvastatin (CRESTOR) 5 MG tablet, Take 1 tablet (5 mg total) by mouth daily., Disp: 90 tablet, Rfl: 0   RYBELSUS 14 MG TABS, TAKE 1 TABLET (14 MG TOTAL) BY MOUTH DAILY, Disp: 30 tablet, Rfl: 2   solifenacin (VESICARE) 5 MG tablet, TAKE 1 TABLET BY MOUTH DAILY, Disp: 30 tablet, Rfl: 2   Vitamin D, Ergocalciferol, 50000 units CAPS, TAKE 1 CAPSULE BY MOUTH EVERY 7 DAYS, Disp: 5 capsule, Rfl: 5   No Known Allergies  CONSTITUTIONAL: Negative for chills, fatigue, fever, unintentional weight gain and unintentional weight loss.  E/N/T: Negative for ear pain, nasal congestion and sore throat.  CARDIOVASCULAR: Negative for chest pain, dizziness, palpitations and pedal edema.  RESPIRATORY: Negative for recent cough and dyspnea.  GASTROINTESTINAL:see HPI MSK:see HPI INTEGUMENTARY: Negative for rash.  NEUROLOGICAL: Negative for dizziness and headaches.  PSYCHIATRIC: Negative for sleep disturbance and to question depression screen.  Negative for depression, negative for anhedonia.        Objective: PHYSICAL EXAM:   VS: BP 102/68 (BP Location: Left Arm, Patient Position: Sitting)   Pulse 67   Temp 97.8 F (36.6 C) (Temporal)   Ht 5\' 5"  (1.651 m)   Wt 241 lb (109.3 kg)    SpO2 97%   BMI 40.10 kg/m   GEN: Well nourished, well developed, in no acute distress  Cardiac: RRR; no murmurs, rubs, or gallops,no edema -  Respiratory:  normal respiratory rate and pattern with no distress - normal breath sounds with no rales, rhonchi, wheezes or rubs MS: no deformity or atrophy  Skin: warm and dry, no rash  Neuro:  Alert and Oriented x 3,- CN II-Xii grossly intact Psych: euthymic mood, appropriate affect and demeanor     Health Maintenance Due  Topic Date Due   Diabetic kidney evaluation - Urine ACR  06/27/2023    There are no preventive care reminders to display for this patient.  Lab Results  Component Value Date   TSH 2.920 03/19/2023   Lab Results  Component Value Date   WBC 6.2 06/26/2023   HGB 11.6 (L) 06/26/2023   HCT 35.0 (L) 06/26/2023   MCV 95.3 06/26/2023   PLT 268.0 06/26/2023   Lab Results  Component Value Date   NA 139 06/26/2023   K 4.0 06/26/2023   CO2 29 06/26/2023   GLUCOSE 135 (H) 06/26/2023   BUN 18 06/26/2023   CREATININE 1.13 06/26/2023   BILITOT 0.4 06/26/2023   ALKPHOS 52 06/26/2023   AST 22 06/26/2023   ALT 38 (H) 06/26/2023   PROT 7.0 06/26/2023   ALBUMIN 3.9 06/26/2023   CALCIUM 9.5 06/26/2023   ANIONGAP 11 06/27/2020   EGFR 53 (L) 03/19/2023   GFR 51.26 (L) 06/26/2023   Lab Results  Component Value Date   CHOL 133 03/19/2023   Lab Results  Component Value Date   HDL 37 (L) 03/19/2023   Lab Results  Component Value Date   LDLCALC 65 03/19/2023   Lab Results  Component Value Date   TRIG 188 (H) 03/19/2023   Lab Results  Component Value Date   CHOLHDL 3.6 03/19/2023   Lab Results  Component Value  Date   HGBA1C 10.1 (H) 03/19/2023      Assessment & Plan:   Problem List Items Addressed This Visit     Endocrine   Diabetes mellitus type II, non insulin dependent (HCC)   Relevant Orders   Hemoglobin A1c Cbc Cmp Continue current meds and efforts at weight loss Watch diet Rx for  glucometer and supplies        Anxiety with depression Continue citalopram 30mg  qd  Nausea with generalized abdominal pain Follow up with Dr Chales Abrahams as scheduled     Other   Hyperlipidemia associated with diabetes (HCC)   Relevant Orders   Lipid panel Watch diet Continue crestor 5mg  qd         Vit D def Continue supplement Labwork pending    Other Visit Diagnoses     Rheumatoid arthritis involving multiple sites, unspecified whether rheumatoid factor present (HCC)       Relevant Orders   Continue meds and follow up with rheumatology as directed      Need for flu vaccine Trivalent fluad given          Meds ordered this encounter  Medications   Blood Glucose Monitoring Suppl DEVI    Sig: 1 each by Does not apply route in the morning, at noon, and at bedtime. May substitute to any manufacturer covered by patient's insurance.    Dispense:  1 each    Refill:  0    Order Specific Question:   Supervising Provider    Answer:   COX, Aniceto Boss   Glucose Blood (BLOOD GLUCOSE TEST STRIPS) STRP    Sig: 1 each by In Vitro route in the morning, at noon, and at bedtime. May substitute to any manufacturer covered by patient's insurance.    Dispense:  100 strip    Refill:  0    Order Specific Question:   Supervising Provider    Answer:   Corey Harold   Lancet Device MISC    Sig: 1 each by Does not apply route in the morning, at noon, and at bedtime. May substitute to any manufacturer covered by patient's insurance.    Dispense:  1 each    Refill:  0    Order Specific Question:   Supervising Provider    Answer:   Corey Harold   Lancets Misc. MISC    Sig: 1 each by Does not apply route in the morning, at noon, and at bedtime. May substitute to any manufacturer covered by patient's insurance.    Dispense:  100 each    Refill:  0    Order Specific Question:   Supervising Provider    Answer:   Corey Harold    Follow-up: Return in about 3 months  (around 10/19/2023) for fasting physical with pap - 40 min.

## 2023-07-22 LAB — CBC WITH DIFFERENTIAL/PLATELET
Basophils Absolute: 0.1 10*3/uL (ref 0.0–0.2)
Basos: 1 %
EOS (ABSOLUTE): 0.2 10*3/uL (ref 0.0–0.4)
Eos: 3 %
Hematocrit: 36.5 % (ref 34.0–46.6)
Hemoglobin: 11.6 g/dL (ref 11.1–15.9)
Immature Grans (Abs): 0 10*3/uL (ref 0.0–0.1)
Immature Granulocytes: 0 %
Lymphocytes Absolute: 1.6 10*3/uL (ref 0.7–3.1)
Lymphs: 24 %
MCH: 31 pg (ref 26.6–33.0)
MCHC: 31.8 g/dL (ref 31.5–35.7)
MCV: 98 fL — ABNORMAL HIGH (ref 79–97)
Monocytes Absolute: 0.8 10*3/uL (ref 0.1–0.9)
Monocytes: 12 %
Neutrophils Absolute: 4 10*3/uL (ref 1.4–7.0)
Neutrophils: 60 %
Platelets: 280 10*3/uL (ref 150–450)
RBC: 3.74 x10E6/uL — ABNORMAL LOW (ref 3.77–5.28)
RDW: 14.6 % (ref 11.7–15.4)
WBC: 6.7 10*3/uL (ref 3.4–10.8)

## 2023-07-22 LAB — IRON,TIBC AND FERRITIN PANEL
Ferritin: 394 ng/mL — ABNORMAL HIGH (ref 15–150)
Iron Saturation: 24 % (ref 15–55)
Iron: 65 ug/dL (ref 27–139)
Total Iron Binding Capacity: 270 ug/dL (ref 250–450)
UIBC: 205 ug/dL (ref 118–369)

## 2023-07-22 LAB — MICROALBUMIN / CREATININE URINE RATIO
Creatinine, Urine: 143.8 mg/dL
Microalb/Creat Ratio: 21 mg/g{creat} (ref 0–29)
Microalbumin, Urine: 30.1 ug/mL

## 2023-07-22 LAB — LIPID PANEL
Chol/HDL Ratio: 3.9 {ratio} (ref 0.0–4.4)
Cholesterol, Total: 153 mg/dL (ref 100–199)
HDL: 39 mg/dL — ABNORMAL LOW (ref >39)
LDL Chol Calc (NIH): 87 mg/dL (ref 0–99)
Triglycerides: 154 mg/dL — ABNORMAL HIGH (ref 0–149)
VLDL Cholesterol Cal: 27 mg/dL (ref 5–40)

## 2023-07-22 LAB — COMPREHENSIVE METABOLIC PANEL
ALT: 34 [IU]/L — ABNORMAL HIGH (ref 0–32)
AST: 23 [IU]/L (ref 0–40)
Albumin: 4.1 g/dL (ref 3.9–4.9)
Alkaline Phosphatase: 54 [IU]/L (ref 44–121)
BUN/Creatinine Ratio: 21 (ref 12–28)
BUN: 24 mg/dL (ref 8–27)
Bilirubin Total: 0.3 mg/dL (ref 0.0–1.2)
CO2: 23 mmol/L (ref 20–29)
Calcium: 9.7 mg/dL (ref 8.7–10.3)
Chloride: 104 mmol/L (ref 96–106)
Creatinine, Ser: 1.17 mg/dL — ABNORMAL HIGH (ref 0.57–1.00)
Globulin, Total: 2.5 g/dL (ref 1.5–4.5)
Glucose: 123 mg/dL — ABNORMAL HIGH (ref 70–99)
Potassium: 4.7 mmol/L (ref 3.5–5.2)
Sodium: 141 mmol/L (ref 134–144)
Total Protein: 6.6 g/dL (ref 6.0–8.5)
eGFR: 52 mL/min/{1.73_m2} — ABNORMAL LOW (ref >59)

## 2023-07-22 LAB — HEMOGLOBIN A1C
Est. average glucose Bld gHb Est-mCnc: 148 mg/dL
Hgb A1c MFr Bld: 6.8 % — ABNORMAL HIGH (ref 4.8–5.6)

## 2023-07-22 LAB — VITAMIN D 25 HYDROXY (VIT D DEFICIENCY, FRACTURES): Vit D, 25-Hydroxy: 49.1 ng/mL (ref 30.0–100.0)

## 2023-07-22 LAB — TSH: TSH: 1.53 u[IU]/mL (ref 0.450–4.500)

## 2023-07-30 ENCOUNTER — Ambulatory Visit (HOSPITAL_COMMUNITY): Payer: BC Managed Care – PPO

## 2023-08-27 ENCOUNTER — Encounter: Payer: Self-pay | Admitting: Physician Assistant

## 2023-08-27 ENCOUNTER — Ambulatory Visit: Payer: BC Managed Care – PPO | Admitting: Physician Assistant

## 2023-08-27 VITALS — BP 114/62 | HR 85 | Temp 97.5°F | Ht 65.0 in | Wt 245.0 lb

## 2023-08-27 DIAGNOSIS — J060 Acute laryngopharyngitis: Secondary | ICD-10-CM | POA: Diagnosis not present

## 2023-08-27 MED ORDER — AMOXICILLIN-POT CLAVULANATE 875-125 MG PO TABS: 1.0000 | ORAL_TABLET | Freq: Two times a day (BID) | ORAL | 0 refills | Status: DC

## 2023-08-27 NOTE — Progress Notes (Signed)
 Acute Office Visit  Subjective:    Patient ID: Alexandra [name], female    DOB: Jul 26, 1956, 68 y.o.   MRN: [id number]  Chief Complaint  Patient presents with   Cough/congestion    HPI: Patient is in today for complaints of cough, congestion sore throat and PND for the past few weeks.  Cough has not been productive.  Denies fever.   She has been using mucinex as needed   Current Outpatient Medications:    ALPRAZolam  (XANAX ) 0.5 MG tablet, Take 1 tablet (0.5 mg total) by mouth as directed. Take 1 tablet 2 hours before procedure. If still take next pill right before procedure, Disp: 2 tablet, Rfl: 0   amoxicillin -clavulanate (AUGMENTIN ) 875-125 MG tablet, Take 1 tablet by mouth 2 (two) times daily., Disp: 20 tablet, Rfl: 0   ascorbic acid  (VITAMIN C) 500 MG tablet, Take 500 mg by mouth daily., Disp: , Rfl:    Blood Glucose Monitoring Suppl DEVI, 1 each by Does not apply route in the morning, at noon, and at bedtime. May substitute to any manufacturer covered by patient's insurance., Disp: 1 each, Rfl: 0   citalopram  (CELEXA ) 20 MG tablet, 1 and 1/2 po qd, Disp: 45 tablet, Rfl: 5   folic acid  (FOLVITE ) 1 MG tablet, Take 1 mg by mouth daily., Disp: , Rfl:    metFORMIN  (GLUCOPHAGE ) 1000 MG tablet, TAKE ONE TABLET BY MOUTH TWICE DAILY WITH A MEAL, Disp: 180 tablet, Rfl: 0   methotrexate (RHEUMATREX) 2.5 MG tablet, Take 2.5 mg by mouth once a week. Take 10 tablets by mouth weekly, Disp: , Rfl:    mometasone  (ELOCON ) 0.1 % cream, APPLY TO AFFECTED AREA TWICE DAILY, Disp: 15 g, Rfl: 2   montelukast  (SINGULAIR ) 10 MG tablet, Take 1 tablet (10 mg total) by mouth daily., Disp: 90 tablet, Rfl: 3   ondansetron  (ZOFRAN -ODT) 4 MG disintegrating tablet, Take 1 tablet (4 mg total) by mouth every 6 (six) hours as needed for nausea or vomiting., Disp: 20 tablet, Rfl: 0   pioglitazone  (ACTOS ) 15 MG tablet, take ONE tablet by MOUTH daily, Disp: 30 tablet, Rfl: 2   predniSONE  (DELTASONE ) 5 MG tablet, TAKE 1 TABLET  BY MOUTH DAILY AS NEEDED, Disp: 30 tablet, Rfl: 1   ramipril  (ALTACE ) 5 MG capsule, Take 1 capsule (5 mg total) by mouth daily., Disp: 90 capsule, Rfl: 0   rosuvastatin  (CRESTOR ) 5 MG tablet, Take 1 tablet (5 mg total) by mouth daily., Disp: 90 tablet, Rfl: 0   RYBELSUS  14 MG TABS, TAKE 1 TABLET (14 MG TOTAL) BY MOUTH DAILY, Disp: 30 tablet, Rfl: 2   solifenacin  (VESICARE ) 5 MG tablet, TAKE 1 TABLET BY MOUTH DAILY, Disp: 30 tablet, Rfl: 2   Vitamin D , Ergocalciferol , 50000 units CAPS, TAKE 1 CAPSULE BY MOUTH EVERY 7 DAYS, Disp: 5 capsule, Rfl: 5  No Known Allergies  ROS CONSTITUTIONAL: Negative for chills, fatigue, fever,   E/N/T: see HPI CARDIOVASCULAR: Negative for chest pain, dizziness, palpitations  RESPIRATORY: see HPI GASTROINTESTINAL: Negative for abdominal pain, acid reflux symptoms, constipation, diarrhea, nausea and vomiting.       Objective:    PHYSICAL EXAM:   BP 114/62 (BP Location: Left Arm, Patient Position: Sitting)   Pulse 85   Temp (!) 97.5 F (36.4 C) (Temporal)   Ht 5' 5 (1.651 m)   Wt 245 lb (111.1 kg)   SpO2 97%   BMI 40.77 kg/m    GEN: Well nourished, well developed, in no acute distress  HEENT:  normal external ears and nose - normal external auditory canals and TMS -  - Lips, Teeth and Gums - normal  Oropharynx - erythema/pnd Cardiac: RRR; no murmurs, rubs, or gallops,no edema -  Respiratory:  normal respiratory rate and pattern with no distress - normal breath sounds with no rales, rhonchi, wheezes or rubs     Assessment & Plan:    Acute laryngopharyngitis -     Amoxicillin -Pot Clavulanate; Take 1 tablet by mouth 2 (two) times daily.  Dispense: 20 tablet; Refill: 0 Continue mucinex    Follow-up: Return if symptoms worsen or fail to improve.  An After Visit Summary was printed and given to the patient.  CAMIE JONELLE NICHOLAUS DEVONNA [facility] [phone]

## 2023-09-01 ENCOUNTER — Ambulatory Visit (HOSPITAL_COMMUNITY): Admission: RE | Admit: 2023-09-01 | Payer: BC Managed Care – PPO | Source: Ambulatory Visit

## 2023-09-01 ENCOUNTER — Other Ambulatory Visit: Payer: Self-pay | Admitting: Physician Assistant

## 2023-09-23 ENCOUNTER — Other Ambulatory Visit: Payer: Self-pay | Admitting: Physician Assistant

## 2023-09-23 DIAGNOSIS — E782 Mixed hyperlipidemia: Secondary | ICD-10-CM

## 2023-09-23 DIAGNOSIS — E119 Type 2 diabetes mellitus without complications: Secondary | ICD-10-CM

## 2023-09-29 ENCOUNTER — Ambulatory Visit: Payer: BC Managed Care – PPO | Admitting: Gastroenterology

## 2023-10-16 ENCOUNTER — Other Ambulatory Visit: Payer: Self-pay | Admitting: Physician Assistant

## 2023-10-16 DIAGNOSIS — E119 Type 2 diabetes mellitus without complications: Secondary | ICD-10-CM

## 2023-10-29 ENCOUNTER — Encounter: Payer: BC Managed Care – PPO | Admitting: Physician Assistant

## 2023-11-17 ENCOUNTER — Other Ambulatory Visit: Payer: Self-pay | Admitting: Physician Assistant

## 2023-11-17 DIAGNOSIS — E119 Type 2 diabetes mellitus without complications: Secondary | ICD-10-CM

## 2023-11-23 ENCOUNTER — Other Ambulatory Visit: Payer: Self-pay | Admitting: Physician Assistant

## 2023-11-27 ENCOUNTER — Encounter: Payer: BC Managed Care – PPO | Admitting: Physician Assistant

## 2024-01-07 ENCOUNTER — Encounter: Payer: Self-pay | Admitting: Physician Assistant

## 2024-01-07 ENCOUNTER — Ambulatory Visit (INDEPENDENT_AMBULATORY_CARE_PROVIDER_SITE_OTHER): Admitting: Physician Assistant

## 2024-01-07 VITALS — BP 108/68 | HR 91 | Temp 98.0°F | Resp 18 | Ht 63.5 in | Wt 230.8 lb

## 2024-01-07 DIAGNOSIS — Z5320 Procedure and treatment not carried out because of patient's decision for unspecified reasons: Secondary | ICD-10-CM | POA: Insufficient documentation

## 2024-01-07 DIAGNOSIS — R7989 Other specified abnormal findings of blood chemistry: Secondary | ICD-10-CM | POA: Insufficient documentation

## 2024-01-07 DIAGNOSIS — Z0000 Encounter for general adult medical examination without abnormal findings: Secondary | ICD-10-CM | POA: Diagnosis not present

## 2024-01-07 DIAGNOSIS — K861 Other chronic pancreatitis: Secondary | ICD-10-CM | POA: Insufficient documentation

## 2024-01-07 DIAGNOSIS — Z1231 Encounter for screening mammogram for malignant neoplasm of breast: Secondary | ICD-10-CM | POA: Insufficient documentation

## 2024-01-07 DIAGNOSIS — Z23 Encounter for immunization: Secondary | ICD-10-CM | POA: Insufficient documentation

## 2024-01-07 DIAGNOSIS — E559 Vitamin D deficiency, unspecified: Secondary | ICD-10-CM

## 2024-01-07 DIAGNOSIS — E119 Type 2 diabetes mellitus without complications: Secondary | ICD-10-CM

## 2024-01-07 DIAGNOSIS — R1084 Generalized abdominal pain: Secondary | ICD-10-CM

## 2024-01-07 LAB — POCT URINALYSIS DIP (CLINITEK)
Bilirubin, UA: NEGATIVE
Blood, UA: NEGATIVE
Glucose, UA: NEGATIVE mg/dL
Ketones, POC UA: NEGATIVE mg/dL
Leukocytes, UA: NEGATIVE
Nitrite, UA: NEGATIVE
POC PROTEIN,UA: NEGATIVE
Spec Grav, UA: 1.015 (ref 1.010–1.025)
Urobilinogen, UA: 0.2 U/dL
pH, UA: 5 (ref 5.0–8.0)

## 2024-01-07 MED ORDER — DICYCLOMINE HCL 20 MG PO TABS: 20.0000 mg | ORAL_TABLET | Freq: Four times a day (QID) | ORAL | 1 refills | Status: DC

## 2024-01-07 NOTE — Progress Notes (Signed)
 Subjective:  Patient ID: Alexandra [name], female    DOB: 22-Apr-1956  Age: 68 y.o. MRN: [id number]  Chief Complaint  Patient presents with   Annual Exam    HPI Well Adult Physical: Patient here for a comprehensive physical exam.The patient reports she continues to have stomach upset with bloating and some loose bms - she is supposed to have already followed up with GI Dr Venice Gillis but has not made that appt -  states she will do that -  Do you take any herbs or supplements that were not prescribed by a doctor? no Are you taking calcium  supplements? no Are you taking aspirin daily? no  Encounter for general adult medical examination without abnormal findings  Physical ("At Risk" items are starred): Patient's last physical exam was 1 year ago .  Patient is not afflicted from Stress Incontinence and Urge Incontinence  Patient wears a seat belts Patient has smoke detectors and has carbon monoxide detectors. Patient wears sunscreen with extended sun exposure. Dental Care: brushes and flosses daily. Last dental visit: is due Vision impairments: wears glasses Ophthalmology/Optometry: appt today Hearing loss: none  No LMP recorded. Patient is postmenopausal.  Safe at home: Yes Self breast exams: Yes Last pap: is due today but pt defers Last mammogram: would like to schedule Pt would like pneumonia shot     01/07/2024    8:53 AM 07/21/2023   10:52 AM 03/19/2023   10:28 AM 10/10/2022   10:57 AM 06/26/2022   10:50 AM  Depression screen PHQ 2/9  Decreased Interest 0 0 0 0 1  Down, Depressed, Hopeless 3 0 1 1 1   PHQ - 2 Score 3 0 1 1 2   Altered sleeping 0 0 0 0 0  Tired, decreased energy 3 1 3 1  0  Change in appetite 2 1 1  0 0  Feeling bad or failure about yourself  3 0 1 3 2   Trouble concentrating 0 0 0 2 0  Moving slowly or fidgety/restless 0 0 0 0 0  Suicidal thoughts 0 0 0 0 0  PHQ-9 Score 11 2 6 7 4   Difficult doing work/chores Somewhat difficult Not difficult at all Not difficult at all  Not difficult at all Not difficult at all         04/17/2021    9:54 AM 06/26/2022   10:50 AM 03/19/2023   10:28 AM 07/21/2023   10:52 AM 01/07/2024    8:54 AM  Fall Risk  Falls in the past year? 0 0 0 0 0  Was there an injury with Fall? 0 0 0 0 0  Fall Risk Category Calculator 0 0 0 0 0  Fall Risk Category (Retired) Low Low     (RETIRED) Patient Fall Risk Level Low fall risk Low fall risk     Patient at Risk for Falls Due to No Fall Risks No Fall Risks No Fall Risks No Fall Risks No Fall Risks  Fall risk Follow up Falls evaluation completed Falls evaluation completed Falls evaluation completed Falls evaluation completed Falls evaluation completed             Social Hx   Social History   Socioeconomic History   Marital status: Single    Spouse name: Not on file   Number of children: Not on file   Years of education: Not on file   Highest education level: High school graduate  Occupational History   Occupation: Freight forwarder  Tobacco Use   Smoking status: Never  Smokeless tobacco: Never  Vaping Use   Vaping status: Never Used  Substance and Sexual Activity   Alcohol use: Not Currently   Drug use: Never   Sexual activity: Not Currently  Other Topics Concern   Not on file  Social History Narrative   Lives alone   Right handed   Caffeine: never   Social Drivers of Health   Financial Resource Strain: Low Risk  (01/07/2024)   Overall Financial Resource Strain (CARDIA)    Difficulty of Paying Living Expenses: Not hard at all  Food Insecurity: No Food Insecurity (01/07/2024)   Hunger Vital Sign    Worried About Running Out of Food in the Last Year: Never true    Ran Out of Food in the Last Year: Never true  Transportation Needs: No Transportation Needs (01/07/2024)   PRAPARE - Administrator, Civil Service (Medical): No    Lack of Transportation (Non-Medical): No  Physical Activity: Inactive (01/07/2024)   Exercise Vital Sign    Days of Exercise per Week:  0 days    Minutes of Exercise per Session: 0 min  Stress: No Stress Concern Present (01/07/2024)   [employer] of Occupational Health - Occupational Stress Questionnaire    Feeling of Stress : Not at all  Social Connections: Socially Isolated (01/07/2024)   Social Connection and Isolation Panel [NHANES]    Frequency of Communication with Friends and Family: More than three times a week    Frequency of Social Gatherings with Friends and Family: More than three times a week    Attends Religious Services: Never    Database administrator or Organizations: No    Attends Engineer, structural: Never    Marital Status: Never married   Past Medical History:  Diagnosis Date   Gastro-esophageal reflux disease without esophagitis    Major depressive disorder, single episode, mild (HCC)    Mixed hyperlipidemia    Other cholangitis    Other fatigue    Other specified rheumatoid arthritis, multiple sites (HCC)    Perioral dermatitis    Type 2 diabetes mellitus with other specified complication [facility])    Past Surgical History:  Procedure Laterality Date   ANTERIOR CERVICAL DECOMP/DISCECTOMY FUSION N/A 06/27/2020   Procedure: Cervical three-four Anterior cervical decompression/discectomy/fusion;  Surgeon: Manya Sells, MD;  [facility];  Service: Neurosurgery;  Laterality: N/A;   CERVICAL SPINE SURGERY  06/2020   CHOLECYSTECTOMY     ERCP      Family History  Problem Relation Age of Onset   Emphysema Mother    Diabetes Mother    Liver cancer Father    Neuropathy Neg Hx    Colon cancer Neg Hx    Esophageal cancer Neg Hx    Stomach cancer Neg Hx     ROS CONSTITUTIONAL: Negative for chills, fatigue, fever, unintentional weight gain and unintentional weight loss.  E/N/T: Negative for ear pain, nasal congestion and sore throat.  CARDIOVASCULAR: Negative for chest pain, dizziness, palpitations and pedal edema.  RESPIRATORY: Negative for recent cough and dyspnea.   GASTROINTESTINAL: see HPI MSK: Negative for arthralgias and myalgias.  INTEGUMENTARY: Negative for rash.  NEUROLOGICAL: Negative for dizziness and headaches.  PSYCHIATRIC: Negative for sleep disturbance and to question depression screen.  Negative for depression, negative for anhedonia.   Objective:  PHYSICAL EXAM:   BP 108/68   Pulse 91   Temp 98 F (36.7 C) (Temporal)   Resp 18   Ht 5' 3.5" (1.613 m)  Wt 230 lb 12.8 oz (104.7 kg)   SpO2 98%   BMI 40.24 kg/m   Vision Screening   Right eye Left eye Both eyes  Without correction     With correction 20/50 20/63 20/50     GEN: Well nourished, well developed, in no acute distress  HEENT: normal external ears and nose - normal external auditory canals and TMS - hearing grossly normal - - Lips, Teeth and Gums - normal  Oropharynx - normal mucosa, palate, and posterior pharynx Neck: no JVD or masses - no thyromegaly Cardiac: RRR; no murmurs, rubs, or gallops,no edema -  Respiratory:  normal respiratory rate and pattern with no distress - normal breath sounds with no rales, rhonchi, wheezes or rubs GI: normal bowel sounds, no masses or tenderness MS: no deformity or atrophy  Skin: warm and dry, no rash  Neuro:  Alert and Oriented x 3, - CN II-Xii grossly intact Psych: euthymic mood, appropriate affect and demeanor  Office Visit on 01/07/2024  Component Date Value Ref Range Status   Color, UA 01/07/2024 yellow  yellow Final   Clarity, UA 01/07/2024 clear  clear Final   Glucose, UA 01/07/2024 negative  negative mg/dL Final   Bilirubin, UA 40/98/1191 negative  negative Final   Ketones, POC UA 01/07/2024 negative  negative mg/dL Final   Spec Grav, UA 47/82/9562 1.015  1.010 - 1.025 Final   Blood, UA 01/07/2024 negative  negative Final   pH, UA 01/07/2024 5.0  5.0 - 8.0 Final   POC PROTEIN,UA 01/07/2024 negative  negative, trace Final   Urobilinogen, UA 01/07/2024 0.2  0.2 or 1.0 E.U./dL Final   Nitrite, UA 13/03/6577 Negative   Negative Final   Leukocytes, UA 01/07/2024 Negative  Negative Final    Assessment & Plan:  Routine physical examination -     POCT URINALYSIS DIP (CLINITEK) -     CBC with Differential/Platelet -     Comprehensive metabolic panel with GFR -     TSH -     Lipid panel -     Hemoglobin A1c -     VITAMIN D  25 Hydroxy (Vit-D Deficiency, Fractures)  Pap smear of cervix declined  Diabetes mellitus type II, non insulin  dependent (HCC) -     POCT URINALYSIS DIP (CLINITEK)  Vitamin D  deficiency -     VITAMIN D  25 Hydroxy (Vit-D Deficiency, Fractures)  Elevated ferritin -     Iron, TIBC and Ferritin Panel  Need for prophylactic vaccination against Streptococcus pneumoniae (pneumococcus) -     Pneumococcal conjugate vaccine 20-valent  Encounter for screening mammogram for breast cancer -     3D Screening Mammogram, Left and Right; Future  Other chronic pancreatitis (HCC) Pt will make follow up appt with Dr Venice Gillis Generalized abdominal pain -     Dicyclomine HCl; Take 1 tablet (20 mg total) by mouth every 6 (six) hours.  Dispense: 90 tablet; Refill: 1    This is a list of the screening recommended for you and due dates:  Health Maintenance  Topic Date Due   Mammogram  12/25/2023   Eye exam for diabetics  01/07/2024*   Zoster (Shingles) Vaccine (1 of 2) 04/08/2024*   Colon Cancer Screening  07/20/2024*   DEXA scan (bone density measurement)  01/06/2025*   Hemoglobin A1C  01/19/2024   Flu Shot  03/19/2024   Yearly kidney function blood test for diabetes  07/20/2024   Yearly kidney health urinalysis for diabetes  07/20/2024   Complete foot exam  07/20/2024   DTaP/Tdap/Td vaccine (2 - Td or Tdap) 07/02/2027   Pap with HPV screening  10/11/2027   Pneumonia Vaccine  Completed   HPV Vaccine  Aged Out   Meningitis B Vaccine  Aged Out   COVID-19 Vaccine  Discontinued   Hepatitis C Screening  Discontinued   HIV Screening  Discontinued  *Topic was postponed. The date shown is not  the original due date.     Follow-up: Return in about 4 months (around 05/09/2024) for chronic fasting follow-up.  An After Visit Summary was printed and given to the patient.  Anthonette Bastos [facility] [phone]

## 2024-01-08 ENCOUNTER — Ambulatory Visit: Payer: Self-pay | Admitting: Physician Assistant

## 2024-01-08 ENCOUNTER — Ambulatory Visit: Payer: Self-pay | Admitting: *Deleted

## 2024-01-08 LAB — CBC WITH DIFFERENTIAL/PLATELET
Basophils Absolute: 0.1 10*3/uL (ref 0.0–0.2)
Basos: 1 %
EOS (ABSOLUTE): 0.1 10*3/uL (ref 0.0–0.4)
Eos: 2 %
Hematocrit: 37.8 % (ref 34.0–46.6)
Hemoglobin: 12.2 g/dL (ref 11.1–15.9)
Immature Grans (Abs): 0 10*3/uL (ref 0.0–0.1)
Immature Granulocytes: 1 %
Lymphocytes Absolute: 1.5 10*3/uL (ref 0.7–3.1)
Lymphs: 23 %
MCH: 32.1 pg (ref 26.6–33.0)
MCHC: 32.3 g/dL (ref 31.5–35.7)
MCV: 100 fL — ABNORMAL HIGH (ref 79–97)
Monocytes Absolute: 0.7 10*3/uL (ref 0.1–0.9)
Monocytes: 11 %
Neutrophils Absolute: 4.1 10*3/uL (ref 1.4–7.0)
Neutrophils: 62 %
Platelets: 262 10*3/uL (ref 150–450)
RBC: 3.8 x10E6/uL (ref 3.77–5.28)
RDW: 13.4 % (ref 11.7–15.4)
WBC: 6.5 10*3/uL (ref 3.4–10.8)

## 2024-01-08 LAB — COMPREHENSIVE METABOLIC PANEL WITH GFR
ALT: 21 IU/L (ref 0–32)
AST: 20 IU/L (ref 0–40)
Albumin: 4.2 g/dL (ref 3.9–4.9)
Alkaline Phosphatase: 67 IU/L (ref 44–121)
BUN/Creatinine Ratio: 21 (ref 12–28)
BUN: 27 mg/dL (ref 8–27)
Bilirubin Total: 0.5 mg/dL (ref 0.0–1.2)
CO2: 20 mmol/L (ref 20–29)
Calcium: 10.1 mg/dL (ref 8.7–10.3)
Chloride: 101 mmol/L (ref 96–106)
Creatinine, Ser: 1.27 mg/dL — ABNORMAL HIGH (ref 0.57–1.00)
Globulin, Total: 2.3 g/dL (ref 1.5–4.5)
Glucose: 102 mg/dL — ABNORMAL HIGH (ref 70–99)
Potassium: 5.1 mmol/L (ref 3.5–5.2)
Sodium: 137 mmol/L (ref 134–144)
Total Protein: 6.5 g/dL (ref 6.0–8.5)
eGFR: 47 mL/min/{1.73_m2} — ABNORMAL LOW (ref >59)

## 2024-01-08 LAB — HEMOGLOBIN A1C
Est. average glucose Bld gHb Est-mCnc: 114 mg/dL
Hgb A1c MFr Bld: 5.6 % (ref 4.8–5.6)

## 2024-01-08 LAB — IRON,TIBC AND FERRITIN PANEL
Ferritin: 433 ng/mL — ABNORMAL HIGH (ref 15–150)
Iron Saturation: 12 % — ABNORMAL LOW (ref 15–55)
Iron: 32 ug/dL (ref 27–139)
Total Iron Binding Capacity: 272 ug/dL (ref 250–450)
UIBC: 240 ug/dL (ref 118–369)

## 2024-01-08 LAB — TSH: TSH: 2.64 u[IU]/mL (ref 0.450–4.500)

## 2024-01-08 LAB — LIPID PANEL
Chol/HDL Ratio: 3.3 ratio (ref 0.0–4.4)
Cholesterol, Total: 111 mg/dL (ref 100–199)
HDL: 34 mg/dL — ABNORMAL LOW (ref >39)
LDL Chol Calc (NIH): 52 mg/dL (ref 0–99)
Triglycerides: 146 mg/dL (ref 0–149)
VLDL Cholesterol Cal: 25 mg/dL (ref 5–40)

## 2024-01-08 LAB — VITAMIN D 25 HYDROXY (VIT D DEFICIENCY, FRACTURES): Vit D, 25-Hydroxy: 43.9 ng/mL (ref 30.0–100.0)

## 2024-01-08 NOTE — Telephone Encounter (Signed)
   Chief Complaint: lab results Symptoms: results given- stool test explained and patient will come to office for cards.   Disposition: [] ED /[] Urgent Care (no appt availability in office) / [] Appointment(In office/virtual)/ []  [facility]/ [x] Home Care/ [] Refused Recommended Disposition /[] [facility]/ []  Follow-up with PCP Additional Notes: Patient advised of results and provider recommendations- patient chooses to make follow up appointment when she picks up cards in office. ( Aware of Friday hours)    Copied from CRM #[id number]. Topic: Clinical - Lab/Test Results >> Jan 08, 2024  2:32 PM Adaline Holly wrote: Provided patient with test results. Patient had further questions. Reason for Disposition  Health Information question, no triage required and triager able to answer question  Answer Assessment - Initial Assessment Questions 1. REASON FOR CALL or QUESTION: "What is your reason for calling today?" or "How can I best help you?" or "What question do you have that I can help answer?"     Patient calling for lab results: Vit D normal Hgb A1c normal TSH normal Ferritin is elevated and iron low --- need to come get hemoccult cards this week - also pt has not been back to GI for follow up of abdominal issues and needs to make that appt and will need further workup of this as well (colonoscopy!) Recommend to start otc iron supplements daily Lipid panel stable - HDL low - need to increase physical activity Cmp stable Cbc stable Make follow up appt with me in one month  Patient notified of results- she will come to office to pick up hemoccult card and get instruction- office hours verified and patient will make follow up appointment while getting cards.  Protocols used: Information Only Call - No Triage-A-AH

## 2024-01-21 ENCOUNTER — Telehealth: Payer: Self-pay

## 2024-01-21 ENCOUNTER — Ambulatory Visit

## 2024-01-21 NOTE — Telephone Encounter (Signed)
 Copied from CRM [id number]. Topic: General - Other >> Jan 21, 2024  7:59 AM Carlatta H wrote: Reason for CRM: Please call patient to get mammogram scheduled in [place]//

## 2024-01-21 NOTE — Telephone Encounter (Signed)
 Scheduling attempted on 01/13/24 - left vm to schedule at [facility] on June 18 - LS 01/07/24 - phone rings then hangs up - mobile at [facility] - LS   Pt is now scheduled on 6/18 arriving at 10:20am at [facility]. Pt notified.

## 2024-01-24 ENCOUNTER — Other Ambulatory Visit: Payer: Self-pay | Admitting: Physician Assistant

## 2024-01-24 DIAGNOSIS — E119 Type 2 diabetes mellitus without complications: Secondary | ICD-10-CM

## 2024-01-31 ENCOUNTER — Other Ambulatory Visit: Payer: Self-pay | Admitting: Physician Assistant

## 2024-01-31 DIAGNOSIS — E782 Mixed hyperlipidemia: Secondary | ICD-10-CM

## 2024-02-03 ENCOUNTER — Ambulatory Visit: Payer: Self-pay | Admitting: Physician Assistant

## 2024-02-03 ENCOUNTER — Ambulatory Visit (INDEPENDENT_AMBULATORY_CARE_PROVIDER_SITE_OTHER)

## 2024-02-03 DIAGNOSIS — R899 Unspecified abnormal finding in specimens from other organs, systems and tissues: Secondary | ICD-10-CM | POA: Diagnosis not present

## 2024-02-03 LAB — POC HEMOCCULT BLD/STL (HOME/3-CARD/SCREEN)
Card #2 Fecal Occult Blod, POC: NEGATIVE
Card #3 Fecal Occult Blood, POC: NEGATIVE
Fecal Occult Blood, POC: NEGATIVE

## 2024-02-03 NOTE — Progress Notes (Signed)
 Patient is in office today for a nurse visit for Hemoccult Card. Patient Hemoccult Card results were sent to provider.

## 2024-02-04 ENCOUNTER — Ambulatory Visit
Admission: RE | Admit: 2024-02-04 | Discharge: 2024-02-04 | Disposition: A | Discharge status: Home / Self Care | Source: Ambulatory Visit | Attending: Physician Assistant | Admitting: Physician Assistant

## 2024-02-04 DIAGNOSIS — Z1231 Encounter for screening mammogram for malignant neoplasm of breast: Secondary | ICD-10-CM

## 2024-02-10 ENCOUNTER — Other Ambulatory Visit: Payer: Self-pay | Admitting: Physician Assistant

## 2024-02-10 DIAGNOSIS — E119 Type 2 diabetes mellitus without complications: Secondary | ICD-10-CM

## 2024-02-16 DIAGNOSIS — M1991 Primary osteoarthritis, unspecified site: Secondary | ICD-10-CM | POA: Diagnosis not present

## 2024-02-16 DIAGNOSIS — M0579 Rheumatoid arthritis with rheumatoid factor of multiple sites without organ or systems involvement: Secondary | ICD-10-CM | POA: Diagnosis not present

## 2024-02-16 DIAGNOSIS — R7989 Other specified abnormal findings of blood chemistry: Secondary | ICD-10-CM | POA: Diagnosis not present

## 2024-02-16 DIAGNOSIS — Z79899 Other long term (current) drug therapy: Secondary | ICD-10-CM | POA: Diagnosis not present

## 2024-03-18 ENCOUNTER — Ambulatory Visit: Admitting: Gastroenterology

## 2024-03-24 ENCOUNTER — Encounter: Payer: Self-pay | Admitting: Physician Assistant

## 2024-03-24 ENCOUNTER — Ambulatory Visit: Admitting: Physician Assistant

## 2024-03-24 VITALS — BP 104/62 | HR 66 | Temp 97.7°F | Ht 63.5 in | Wt 224.0 lb

## 2024-03-24 DIAGNOSIS — K047 Periapical abscess without sinus: Secondary | ICD-10-CM

## 2024-03-24 MED ORDER — PENICILLIN V POTASSIUM 500 MG PO TABS: 500.0000 mg | ORAL_TABLET | Freq: Three times a day (TID) | ORAL | 0 refills | Status: DC

## 2024-03-24 NOTE — Progress Notes (Signed)
 Acute Office Visit  Subjective:    Patient ID: Alexandra [name], female    DOB: [birth year], 68 y.o.   MRN: [id number]  Chief Complaint  Patient presents with   Jaw Pain    HPI: Patient is in today for complaints of right jaw pain.  States she woke up with pain and swelling on her left lower jaw.  She had prednisone  at home and took some and it improved slightly Denies fever, cough, congestion Concerned about teeth - no pain but does have some carious teeth and has not been to dentist in few years   Current Outpatient Medications:    Accu-Chek Softclix Lancets lancets, 4 (four) times daily., Disp: , Rfl:    Blood Glucose Monitoring Suppl (ACCU-CHEK GUIDE ME) w/Device KIT, as directed., Disp: , Rfl:    Blood Glucose Monitoring Suppl DEVI, 1 each by Does not apply route in the morning, at noon, and at bedtime. May substitute to any manufacturer covered by patient's insurance., Disp: 1 each, Rfl: 0   citalopram  (CELEXA ) 20 MG tablet, 1 and 1/2 po qd, Disp: 45 tablet, Rfl: 5   dicyclomine  (BENTYL ) 20 MG tablet, Take 1 tablet (20 mg total) by mouth every 6 (six) hours., Disp: 90 tablet, Rfl: 1   folic acid  (FOLVITE ) 1 MG tablet, Take 1 mg by mouth daily., Disp: , Rfl:    metFORMIN  (GLUCOPHAGE ) 1000 MG tablet, TAKE ONE TABLET BY MOUTH TWICE DAILY WITH A MEAL, Disp: 180 tablet, Rfl: 0   methotrexate (RHEUMATREX) 2.5 MG tablet, Take 2.5 mg by mouth once a week. Take 10 tablets by mouth weekly, Disp: , Rfl:    mometasone  (ELOCON ) 0.1 % cream, APPLY TO AFFECTED AREA TWICE DAILY, Disp: 15 g, Rfl: 2   montelukast  (SINGULAIR ) 10 MG tablet, Take 1 tablet (10 mg total) by mouth daily., Disp: 90 tablet, Rfl: 3   penicillin  v potassium (VEETID) 500 MG tablet, Take 1 tablet (500 mg total) by mouth 3 (three) times daily for 14 days., Disp: 42 tablet, Rfl: 0   pioglitazone  (ACTOS ) 15 MG tablet, take ONE tablet by MOUTH daily, Disp: 30 tablet, Rfl: 2   predniSONE  (DELTASONE ) 5 MG tablet, TAKE 1 TABLET BY MOUTH  DAILY AS NEEDED, Disp: 30 tablet, Rfl: 1   ramipril  (ALTACE ) 5 MG capsule, TAKE ONE CAPSULE BY MOUTH DAILY, Disp: 90 capsule, Rfl: 0   rosuvastatin  (CRESTOR ) 5 MG tablet, TAKE ONE TABLET BY MOUTH DAILY, Disp: 90 tablet, Rfl: 0   RYBELSUS  14 MG TABS, TAKE 1 TABLET (14 MG TOTAL) BY MOUTH DAILY, Disp: 30 tablet, Rfl: 2   solifenacin  (VESICARE ) 5 MG tablet, TAKE 1 TABLET BY MOUTH DAILY, Disp: 30 tablet, Rfl: 2   Vitamin D , Ergocalciferol , 50000 units CAPS, TAKE 1 CAPSULE BY MOUTH EVERY 7 DAYS, Disp: 5 capsule, Rfl: 5   ALPRAZolam  (XANAX ) 0.5 MG tablet, Take 1 tablet (0.5 mg total) by mouth as directed. Take 1 tablet 2 hours before procedure. If still take next pill right before procedure (Patient not taking: Reported on 03/24/2024), Disp: 2 tablet, Rfl: 0   ascorbic acid  (VITAMIN C) 500 MG tablet, Take 500 mg by mouth daily. (Patient not taking: Reported on 03/24/2024), Disp: , Rfl:   No Known Allergies  ROS CONSTITUTIONAL: Negative for chills, fatigue, fever,  E/N/T: Negative for ear pain, nasal congestion and sore throat.  CARDIOVASCULAR: Negative for chest pain, RESPIRATORY: Negative for recent cough and dyspnea.   INTEGUMENTARY: Negative for rash.       Objective:  PHYSICAL EXAM:   BP 104/62   Pulse 66   Temp 97.7 F (36.5 C)   Ht 5' 3.5 (1.613 m)   Wt 224 lb (101.6 kg)   SpO2 100%   BMI 39.06 kg/m    GEN: Well nourished, well developed, in no acute distress  HEENT: normal external ears and nose - normal external auditory canals and TMS - - teeth - carious back molar with abscess noted Oropharynx - normal mucosa, palate, and posterior pharynx Cardiac: RRR; no murmurs, Respiratory:  normal respiratory rate and pattern with no distress - normal breath sounds with no rales, rhonchi, wheezes or rubs     Assessment & Plan:    Tooth abscess -     Penicillin  V Potassium; Take 1 tablet (500 mg total) by mouth 3 (three) times daily for 14 days.  Dispense: 42 tablet; Refill:  0 Recommend warm compresses, ibuprofen as needed Follow up with dentist - recommend tooth extraction    Follow-up: Return if symptoms worsen or fail to improve.  An After Visit Summary was printed and given to the patient.  CAMIE JONELLE NICHOLAUS DEVONNA [facility] [phone]

## 2024-04-13 ENCOUNTER — Ambulatory Visit: Admitting: Gastroenterology

## 2024-04-13 NOTE — Progress Notes (Deleted)
 SABRA

## 2024-04-18 ENCOUNTER — Other Ambulatory Visit: Payer: Self-pay | Admitting: Physician Assistant

## 2024-04-20 ENCOUNTER — Other Ambulatory Visit: Payer: Self-pay | Admitting: Physician Assistant

## 2024-04-20 DIAGNOSIS — E119 Type 2 diabetes mellitus without complications: Secondary | ICD-10-CM

## 2024-05-06 ENCOUNTER — Other Ambulatory Visit: Payer: Self-pay | Admitting: Physician Assistant

## 2024-05-12 ENCOUNTER — Ambulatory Visit: Admitting: Physician Assistant

## 2024-05-20 ENCOUNTER — Other Ambulatory Visit: Payer: Self-pay | Admitting: Physician Assistant

## 2024-05-20 DIAGNOSIS — K047 Periapical abscess without sinus: Secondary | ICD-10-CM

## 2024-05-20 DIAGNOSIS — I10 Essential (primary) hypertension: Secondary | ICD-10-CM | POA: Diagnosis not present

## 2024-05-20 DIAGNOSIS — F419 Anxiety disorder, unspecified: Secondary | ICD-10-CM

## 2024-05-20 DIAGNOSIS — H18413 Arcus senilis, bilateral: Secondary | ICD-10-CM | POA: Diagnosis not present

## 2024-05-20 DIAGNOSIS — E113393 Type 2 diabetes mellitus with moderate nonproliferative diabetic retinopathy without macular edema, bilateral: Secondary | ICD-10-CM | POA: Diagnosis not present

## 2024-05-20 DIAGNOSIS — H2513 Age-related nuclear cataract, bilateral: Secondary | ICD-10-CM | POA: Diagnosis not present

## 2024-05-20 LAB — OPHTHALMOLOGY REPORT-SCANNED

## 2024-06-02 ENCOUNTER — Other Ambulatory Visit: Payer: Self-pay | Admitting: Physician Assistant

## 2024-06-02 DIAGNOSIS — R1084 Generalized abdominal pain: Secondary | ICD-10-CM

## 2024-06-17 ENCOUNTER — Other Ambulatory Visit: Payer: Self-pay | Admitting: Physician Assistant

## 2024-06-17 DIAGNOSIS — E782 Mixed hyperlipidemia: Secondary | ICD-10-CM

## 2024-06-17 DIAGNOSIS — E119 Type 2 diabetes mellitus without complications: Secondary | ICD-10-CM

## 2024-06-23 ENCOUNTER — Encounter: Payer: Self-pay | Admitting: Physician Assistant

## 2024-06-23 ENCOUNTER — Ambulatory Visit: Admitting: Physician Assistant

## 2024-06-23 ENCOUNTER — Other Ambulatory Visit

## 2024-06-23 VITALS — BP 132/70 | HR 72 | Temp 98.0°F | Resp 18 | Ht 63.5 in | Wt 238.0 lb

## 2024-06-23 DIAGNOSIS — F419 Anxiety disorder, unspecified: Secondary | ICD-10-CM

## 2024-06-23 DIAGNOSIS — E119 Type 2 diabetes mellitus without complications: Secondary | ICD-10-CM | POA: Diagnosis not present

## 2024-06-23 DIAGNOSIS — E782 Mixed hyperlipidemia: Secondary | ICD-10-CM

## 2024-06-23 DIAGNOSIS — R7989 Other specified abnormal findings of blood chemistry: Secondary | ICD-10-CM

## 2024-06-23 DIAGNOSIS — E559 Vitamin D deficiency, unspecified: Secondary | ICD-10-CM | POA: Diagnosis not present

## 2024-06-23 DIAGNOSIS — M069 Rheumatoid arthritis, unspecified: Secondary | ICD-10-CM

## 2024-06-23 DIAGNOSIS — Z23 Encounter for immunization: Secondary | ICD-10-CM

## 2024-06-23 DIAGNOSIS — K861 Other chronic pancreatitis: Secondary | ICD-10-CM

## 2024-06-23 DIAGNOSIS — N393 Stress incontinence (female) (male): Secondary | ICD-10-CM

## 2024-06-23 NOTE — Progress Notes (Signed)
 Established Patient Office Visit  Subjective:  Patient ID: Alexandra [name], female    DOB: [birth year]  Age: 68 y.o. MRN: [id number]  CC:  Chief Complaint  Patient presents with   Diabetes follow up   HPI [name] presents for follow up diabetes Pt states that she has not been checking her glucose at all -  She is taking glucophage  1000mg  bid , altace  5mg , crestor  5mg  actos  15mg  and rybelsus  14mg  - she has not wanted to start insulin  - last A1c was over 10 She is up to date on eye exam  Pt with history of hyperlipidemia - pt states she is taking crestor  5mg  qd - trying to watch diet Is due for labwork  Pt with history of RA - she sees Dr Mai who has her on methotrexate and prednisone  -she has had recent flare of symptoms and using prednisone  Has follow up later this month  Pt on Celexa  20mg  1 and 1/2 po qd  for anxiety -says she is doing well on this medication - also uses xanax  as needed  Pt with history of Vit D def - is on weekly supplement and due for labwork  Pt is currently following with Dr Charlanne for nausea/ chronic pancreatitis - she was supposed to have MRI done but could not afford it - has follow up with him later this month  Pt with stress incontinence - symptoms stable on vesicare  5mg   Pt would like flu shot today  Past Medical History:  Diagnosis Date   Gastro-esophageal reflux disease without esophagitis    Major depressive disorder, single episode, mild    Mixed hyperlipidemia    Other cholangitis    Other fatigue    Other specified rheumatoid arthritis, multiple sites (HCC)    Perioral dermatitis    Type 2 diabetes mellitus with other specified complication [facility])     Past Surgical History:  Procedure Laterality Date   ANTERIOR CERVICAL DECOMP/DISCECTOMY FUSION N/A 06/27/2020   Procedure: Cervical three-four Anterior cervical decompression/discectomy/fusion;  Surgeon: Unice Pac, MD;  Location: [facility] OR;  Service: Neurosurgery;  Laterality: N/A;    CERVICAL SPINE SURGERY  06/2020   CHOLECYSTECTOMY     ERCP      Family History  Problem Relation Age of Onset   Emphysema Mother    Diabetes Mother    Liver cancer Father    Neuropathy Neg Hx    Colon cancer Neg Hx    Esophageal cancer Neg Hx    Stomach cancer Neg Hx     Social History   Socioeconomic History   Marital status: Single    Spouse name: Not on file   Number of children: Not on file   Years of education: Not on file   Highest education level: High school graduate  Occupational History   Occupation: freight forwarder  Tobacco Use   Smoking status: Never   Smokeless tobacco: Never  Vaping Use   Vaping status: Never Used  Substance and Sexual Activity   Alcohol use: Not Currently   Drug use: Never   Sexual activity: Not Currently  Other Topics Concern   Not on file  Social History Narrative   Lives alone   Right handed   Caffeine: never   Social Drivers of Health   Financial Resource Strain: Low Risk  (01/07/2024)   Overall Financial Resource Strain (CARDIA)    Difficulty of Paying Living Expenses: Not hard at all  Food Insecurity: No Food Insecurity (01/07/2024)   Hunger  Vital Sign    Worried About Programme Researcher, Broadcasting/film/video in the Last Year: Never true    Ran Out of Food in the Last Year: Never true  Transportation Needs: No Transportation Needs (01/07/2024)   PRAPARE - Administrator, Civil Service (Medical): No    Lack of Transportation (Non-Medical): No  Physical Activity: Inactive (01/07/2024)   Exercise Vital Sign    Days of Exercise per Week: 0 days    Minutes of Exercise per Session: 0 min  Stress: No Stress Concern Present (01/07/2024)   [employer] of Occupational Health - Occupational Stress Questionnaire    Feeling of Stress : Not at all  Social Connections: Socially Isolated (01/07/2024)   Social Connection and Isolation Panel    Frequency of Communication with Friends and Family: More than three times a week    Frequency of  Social Gatherings with Friends and Family: More than three times a week    Attends Religious Services: Never    Database Administrator or Organizations: No    Attends Banker Meetings: Never    Marital Status: Never married  Intimate Partner Violence: Not At Risk (01/07/2024)   Humiliation, Afraid, Rape, and Kick questionnaire    Fear of Current or Ex-Partner: No    Emotionally Abused: No    Physically Abused: No    Sexually Abused: No     Current Outpatient Medications:    Accu-Chek Softclix Lancets lancets, 4 (four) times daily., Disp: , Rfl:    ALPRAZolam  (XANAX ) 0.5 MG tablet, Take 1 tablet (0.5 mg total) by mouth as directed. Take 1 tablet 2 hours before procedure. If still take next pill right before procedure, Disp: 2 tablet, Rfl: 0   ascorbic acid  (VITAMIN C) 500 MG tablet, Take 500 mg by mouth daily., Disp: , Rfl:    Blood Glucose Monitoring Suppl (ACCU-CHEK GUIDE ME) w/Device KIT, as directed., Disp: , Rfl:    Blood Glucose Monitoring Suppl DEVI, 1 each by Does not apply route in the morning, at noon, and at bedtime. May substitute to any manufacturer covered by patient's insurance., Disp: 1 each, Rfl: 0   citalopram  (CELEXA ) 20 MG tablet, TAKE 1 AND 1/2 TABLETS BY MOUTH DAILY, Disp: 45 tablet, Rfl: 0   dicyclomine  (BENTYL ) 20 MG tablet, TAKE ONE TABLET BY MOUTH EVERY 6 HOURS, Disp: 90 tablet, Rfl: 1   folic acid  (FOLVITE ) 1 MG tablet, Take 1 mg by mouth daily., Disp: , Rfl:    metFORMIN  (GLUCOPHAGE ) 1000 MG tablet, TAKE ONE TABLET BY MOUTH TWICE DAILY WITH A MEAL, Disp: 180 tablet, Rfl: 0   methotrexate (RHEUMATREX) 2.5 MG tablet, Take 2.5 mg by mouth once a week. Take 10 tablets by mouth weekly, Disp: , Rfl:    mometasone  (ELOCON ) 0.1 % cream, APPLY TO AFFECTED AREA TWICE DAILY, Disp: 15 g, Rfl: 2   montelukast  (SINGULAIR ) 10 MG tablet, TAKE ONE TABLET BY MOUTH DAILY, Disp: 90 tablet, Rfl: 0   pioglitazone  (ACTOS ) 15 MG tablet, TAKE ONE TABLET BY MOUTH DAILY, Disp:  30 tablet, Rfl: 2   predniSONE  (DELTASONE ) 5 MG tablet, TAKE 1 TABLET BY MOUTH DAILY AS NEEDED, Disp: 30 tablet, Rfl: 1   ramipril  (ALTACE ) 5 MG capsule, TAKE ONE CAPSULE BY MOUTH DAILY, Disp: 90 capsule, Rfl: 0   rosuvastatin  (CRESTOR ) 5 MG tablet, TAKE ONE TABLET BY MOUTH DAILY, Disp: 90 tablet, Rfl: 0   RYBELSUS  14 MG TABS, TAKE 1 TABLET (14 MG TOTAL) BY MOUTH  DAILY, Disp: 30 tablet, Rfl: 2   solifenacin  (VESICARE ) 5 MG tablet, TAKE 1 TABLET BY MOUTH DAILY, Disp: 30 tablet, Rfl: 2   Vitamin D , Ergocalciferol , (DRISDOL ) 1.25 MG (50000 UNIT) CAPS capsule, TAKE 1 CAPSULE BY MOUTH EVERY 7 DAYS, Disp: 5 capsule, Rfl: 5   No Known Allergies  CONSTITUTIONAL: Negative for chills, fatigue, fever,  CARDIOVASCULAR: Negative for chest pain, dizziness, palpitations and pedal edema.  RESPIRATORY: Negative for recent cough and dyspnea.  GASTROINTESTINAL: Negative for abdominal pain, acid reflux symptoms, constipation, diarrhea, nausea and vomiting.  MSK: see HPI INTEGUMENTARY: Negative for rash.  PSYCHIATRIC: Negative for sleep disturbance and to question depression screen.  Negative for depression, negative for anhedonia.         PHYSICAL EXAM:   VS: BP 132/70   Pulse 72   Temp 98 F (36.7 C) (Temporal)   Resp 18   Ht 5' 3.5 (1.613 m)   Wt 238 lb (108 kg)   SpO2 99%   BMI 41.50 kg/m   GEN: Well nourished, well developed, in no acute distress  Cardiac: RRR; no murmurs, rubs, or gallops,no edema -  Respiratory:  normal respiratory rate and pattern with no distress - normal breath sounds with no rales, rhonchi, wheezes or rubs GI: normal bowel sounds, no masses or tenderness Skin: warm and dry, no rash  Neuro:  Alert and Oriented x 3, CN II-Xii grossly intact Psych: euthymic mood, appropriate affect and demeanor    Health Maintenance Due  Topic Date Due   Zoster Vaccines- Shingrix (1 of 2) Never done   Diabetic kidney evaluation - Urine ACR  07/20/2024    There are no preventive  care reminders to display for this patient.  Lab Results  Component Value Date   TSH 2.640 01/07/2024   Lab Results  Component Value Date   WBC 6.5 01/07/2024   HGB 12.2 01/07/2024   HCT 37.8 01/07/2024   MCV 100 (H) 01/07/2024   PLT 262 01/07/2024   Lab Results  Component Value Date   NA 137 01/07/2024   K 5.1 01/07/2024   CO2 20 01/07/2024   GLUCOSE 102 (H) 01/07/2024   BUN 27 01/07/2024   CREATININE 1.27 (H) 01/07/2024   BILITOT 0.5 01/07/2024   ALKPHOS 67 01/07/2024   AST 20 01/07/2024   ALT 21 01/07/2024   PROT 6.5 01/07/2024   ALBUMIN 4.2 01/07/2024   CALCIUM  10.1 01/07/2024   ANIONGAP 11 06/27/2020   EGFR 47 (L) 01/07/2024   GFR 51.26 (L) 06/26/2023   Lab Results  Component Value Date   CHOL 111 01/07/2024   Lab Results  Component Value Date   HDL 34 (L) 01/07/2024   Lab Results  Component Value Date   LDLCALC 52 01/07/2024   Lab Results  Component Value Date   TRIG 146 01/07/2024   Lab Results  Component Value Date   CHOLHDL 3.3 01/07/2024   Lab Results  Component Value Date   HGBA1C 5.6 01/07/2024      Assessment & Plan:   Problem List Items Addressed This Visit     Endocrine   Diabetes mellitus type II, non insulin  dependent (HCC)   Relevant Orders   Hemoglobin A1c Cbc Cmp Continue current meds and efforts at weight loss Watch diet Rx for glucometer and supplies        Anxiety with depression Continue citalopram  30mg  qd and xanax  as needed  Chronic pancreatitis Follow up with Dr Charlanne as scheduled  Other   Hyperlipidemia associated with diabetes (HCC)   Relevant Orders   Lipid panel Watch diet Continue crestor  5mg  qd         Vit D def Continue supplement Labwork pending    Other Visit Diagnoses     Rheumatoid arthritis involving multiple sites, unspecified whether rheumatoid factor present (HCC)       Relevant Orders   Continue meds and follow up with rheumatology as directed   Stress incontinence Continue  med  Elevated ferritin Labwork pending    Need for flu vaccine Trivalent fluad given          No orders of the defined types were placed in this encounter.   Follow-up: Return in about 4 months (around 10/21/2024) for chronic fasting follow-up.

## 2024-06-23 NOTE — Addendum Note (Signed)
 Addended byBETHA NICHOLAUS CREDIT on: 06/23/2024 02:57 PM   Modules accepted: Orders

## 2024-06-24 ENCOUNTER — Other Ambulatory Visit: Payer: Self-pay | Admitting: Physician Assistant

## 2024-06-24 ENCOUNTER — Ambulatory Visit: Payer: Self-pay | Admitting: Physician Assistant

## 2024-06-24 ENCOUNTER — Other Ambulatory Visit

## 2024-06-24 DIAGNOSIS — F419 Anxiety disorder, unspecified: Secondary | ICD-10-CM

## 2024-06-24 DIAGNOSIS — E559 Vitamin D deficiency, unspecified: Secondary | ICD-10-CM

## 2024-06-24 DIAGNOSIS — R7989 Other specified abnormal findings of blood chemistry: Secondary | ICD-10-CM

## 2024-06-24 DIAGNOSIS — E119 Type 2 diabetes mellitus without complications: Secondary | ICD-10-CM | POA: Diagnosis not present

## 2024-06-24 DIAGNOSIS — E782 Mixed hyperlipidemia: Secondary | ICD-10-CM | POA: Diagnosis not present

## 2024-06-24 LAB — MICROALBUMIN / CREATININE URINE RATIO
Creatinine, Urine: 80.2 mg/dL
Microalb/Creat Ratio: 11 mg/g{creat} (ref 0–29)
Microalbumin, Urine: 9 ug/mL

## 2024-06-27 LAB — FE+CBC/D/PLT+TIBC+FER+RETIC
Ferritin: 309 ng/mL — ABNORMAL HIGH (ref 15–150)
Iron Saturation: 13 % — ABNORMAL LOW (ref 15–55)
Iron: 42 ug/dL (ref 27–139)
Total Iron Binding Capacity: 313 ug/dL (ref 250–450)
UIBC: 271 ug/dL (ref 118–369)

## 2024-06-27 LAB — COMPREHENSIVE METABOLIC PANEL WITH GFR
ALT: 22 IU/L (ref 0–32)
AST: 20 IU/L (ref 0–40)
Albumin: 3.9 g/dL (ref 3.9–4.9)
Alkaline Phosphatase: 74 IU/L (ref 49–135)
BUN/Creatinine Ratio: 22 (ref 12–28)
BUN: 26 mg/dL (ref 8–27)
Bilirubin Total: 0.4 mg/dL (ref 0.0–1.2)
CO2: 20 mmol/L (ref 20–29)
Calcium: 9.7 mg/dL (ref 8.7–10.3)
Chloride: 104 mmol/L (ref 96–106)
Creatinine, Ser: 1.19 mg/dL — ABNORMAL HIGH (ref 0.57–1.00)
Globulin, Total: 2.8 g/dL (ref 1.5–4.5)
Glucose: 79 mg/dL (ref 70–99)
Potassium: 4.4 mmol/L (ref 3.5–5.2)
Sodium: 137 mmol/L (ref 134–144)
Total Protein: 6.7 g/dL (ref 6.0–8.5)
eGFR: 51 mL/min/1.73 — ABNORMAL LOW (ref >59)

## 2024-06-27 LAB — LIPID PANEL
Chol/HDL Ratio: 4.7 ratio — ABNORMAL HIGH (ref 0.0–4.4)
Cholesterol, Total: 207 mg/dL — ABNORMAL HIGH (ref 100–199)
HDL: 44 mg/dL (ref >39)
LDL Chol Calc (NIH): 139 mg/dL — ABNORMAL HIGH (ref 0–99)
Triglycerides: 132 mg/dL (ref 0–149)
VLDL Cholesterol Cal: 24 mg/dL (ref 5–40)

## 2024-06-27 LAB — TSH: TSH: 2.87 u[IU]/mL (ref 0.450–4.500)

## 2024-06-27 LAB — VITAMIN D 25 HYDROXY (VIT D DEFICIENCY, FRACTURES): Vit D, 25-Hydroxy: 25.8 ng/mL — ABNORMAL LOW (ref 30.0–100.0)

## 2024-06-27 LAB — HEMOGLOBIN A1C

## 2024-06-28 ENCOUNTER — Ambulatory Visit: Payer: Self-pay

## 2024-06-28 NOTE — Telephone Encounter (Signed)
 Spoke with patient, verbalized understanding and had no questions at this time. She wants to only see her provider and wanted an appointment for tomorrow 11.11.2025 at 1540. Patient did not want to go to UC either.

## 2024-06-28 NOTE — Telephone Encounter (Signed)
 I do not have openings and have appt leaving early today --- recommend to see another provider, go to urgent care today for appt to be seen

## 2024-06-28 NOTE — Telephone Encounter (Signed)
 FYI Only or Action Required?: Action required by provider: request for appointment and clinical question for provider.  Patient was last seen in primary care on 06/23/2024 by Nicholaus Credit, PA-C.  Called Nurse Triage reporting Cough, Headache, and Fever.  Symptoms began yesterday.  Interventions attempted: Nothing.  Symptoms are: unchanged.  Triage Disposition: See HCP Within 4 Hours (Or PCP Triage)  Patient/caregiver understands and will follow disposition?: Yes, but will wait                            Copied from CRM [id number]. Topic: Clinical - Red Word Triage >> Jun 28, 2024 10:34 AM Joesph NOVAK wrote: Red Word that prompted transfer to Nurse Triage: Patient had the flu shot last week and now she is sick. Fever 103 last night, coughing. Fatigue, horrible headache. Reason for Disposition  [1] Fever > 100 F (37.8 C) AND [2] diabetes mellitus or weak immune system (e.g., HIV positive, cancer chemo, splenectomy, organ transplant, chronic steroids)  Answer Assessment - Initial Assessment Questions Patient requested an appointment strictly with her PCP today. This RN advised patient that her PCP does not have availability until tomorrow. No availability with alternate provider in office today. This RN offered available appointments at alternate offices within region or UC. Patient declined and requested this RN to ask her PCP if she could work her in today. Please advise. Patient would like a call back.   1. SYMPTOMS: What is your main symptom or concern? (e.g., cough, fever, shortness of breath, muscle aches)     Cough, headache and fever 2. ONSET: When did the symptoms start?      Yesterday  3. COUGH: Do you have a cough? If Yes, ask: How bad is the cough?       Yes, states cough is not productive at this time 4. FEVER: Do you have a fever? If Yes, ask: What is your temperature, how was it measured, and when did it start?     Yes, 103 yesterday and  100.9 today 5. BREATHING DIFFICULTY: Are you having any difficulty breathing? (e.g., normal; shortness of breath, wheezing, unable to speak)      Denies, patient able to speak in clear and complete sentences while on phone with this RN 6. BETTER-SAME-WORSE: Are you getting better, staying the same or getting worse compared to yesterday?  If getting worse, ask, In what way?     About the same, headache has worsened 7. OTHER SYMPTOMS: Do you have any other symptoms?  (e.g., chills, fatigue, headache, loss of smell or taste, muscle pain, sore throat)     Denies sore throat, denies loss of smell or taste 8. INFLUENZA EXPOSURE: Was there any known exposure to influenza (flu) before the symptoms began?      Denies 9. INFLUENZA SUSPECTED: Why do you think you have influenza? (e.g., positive flu self-test at home, symptoms after exposure).     Patient is presenting with high fever and headache as main symptoms 10. INFLUENZA VACCINE: Have you had the flu vaccine? If Yes, ask: When did you last get it?     Yes, received vaccine on Wednesday of last week  11. HIGH RISK FOR COMPLICATIONS: Do you have any chronic medical problems? (e.g., asthma, heart or lung disease, obesity, weak immune system)     Type II diabetes, per chart 12. PREGNANCY: Is there any chance you are pregnant? When was your last menstrual period?  N/A 13. O2 SATURATION MONITOR:  Do you use an oxygen saturation monitor (pulse oximeter) at home? If Yes, ask What is your reading (oxygen level) today? What is your usual oxygen saturation reading? (e.g., 95%)     N/A  Protocols used: Influenza (Flu) Suspected-A-AH

## 2024-06-29 ENCOUNTER — Ambulatory Visit: Admitting: Physician Assistant

## 2024-06-29 ENCOUNTER — Encounter: Payer: Self-pay | Admitting: Physician Assistant

## 2024-06-29 ENCOUNTER — Other Ambulatory Visit: Payer: Self-pay | Admitting: Physician Assistant

## 2024-06-29 ENCOUNTER — Ambulatory Visit: Payer: Self-pay | Admitting: Physician Assistant

## 2024-06-29 VITALS — BP 118/80 | HR 107 | Temp 99.2°F | Ht 63.5 in | Wt 230.8 lb

## 2024-06-29 DIAGNOSIS — E782 Mixed hyperlipidemia: Secondary | ICD-10-CM

## 2024-06-29 DIAGNOSIS — J40 Bronchitis, not specified as acute or chronic: Secondary | ICD-10-CM | POA: Diagnosis not present

## 2024-06-29 DIAGNOSIS — U071 COVID-19: Secondary | ICD-10-CM | POA: Diagnosis not present

## 2024-06-29 LAB — COMPREHENSIVE METABOLIC PANEL WITH GFR

## 2024-06-29 LAB — FE+CBC/D/PLT+TIBC+FER+RETIC
Basophils Absolute: 0.1 x10E3/uL (ref 0.0–0.2)
Basos: 1 %
EOS (ABSOLUTE): 0.1 x10E3/uL (ref 0.0–0.4)
Eos: 2 %
Hematocrit: 36.1 % (ref 34.0–46.6)
Hemoglobin: 11.2 g/dL (ref 11.1–15.9)
Immature Grans (Abs): 0 x10E3/uL (ref 0.0–0.1)
Immature Granulocytes: 0 %
Lymphocytes Absolute: 1.1 x10E3/uL (ref 0.7–3.1)
Lymphs: 22 %
MCH: 31.9 pg (ref 26.6–33.0)
MCHC: 31 g/dL — ABNORMAL LOW (ref 31.5–35.7)
MCV: 103 fL — ABNORMAL HIGH (ref 79–97)
Monocytes Absolute: 0.4 x10E3/uL (ref 0.1–0.9)
Monocytes: 7 %
Neutrophils Absolute: 3.3 x10E3/uL (ref 1.4–7.0)
Neutrophils: 68 %
Platelets: 222 x10E3/uL (ref 150–450)
RBC: 3.51 x10E6/uL — ABNORMAL LOW (ref 3.77–5.28)
RDW: 14.6 % (ref 11.7–15.4)
Retic Ct Pct: 1.2 % (ref 0.6–2.6)
WBC: 4.9 x10E3/uL (ref 3.4–10.8)

## 2024-06-29 LAB — LIPID PANEL

## 2024-06-29 LAB — POC COVID19 BINAXNOW: SARS Coronavirus 2 Ag: POSITIVE — AB

## 2024-06-29 LAB — POCT INFLUENZA A/B
Influenza A, POC: NEGATIVE
Influenza B, POC: NEGATIVE

## 2024-06-29 LAB — VITAMIN D 25 HYDROXY (VIT D DEFICIENCY, FRACTURES)

## 2024-06-29 LAB — HEMOGLOBIN A1C
Est. average glucose Bld gHb Est-mCnc: 131 mg/dL
Hgb A1c MFr Bld: 6.2 % — ABNORMAL HIGH (ref 4.8–5.6)

## 2024-06-29 LAB — TSH

## 2024-06-29 MED ORDER — ROSUVASTATIN CALCIUM 10 MG PO TABS: 10.0000 mg | ORAL_TABLET | Freq: Every day | ORAL | 1 refills | Status: AC

## 2024-06-29 MED ORDER — AMOXICILLIN-POT CLAVULANATE 875-125 MG PO TABS: 1.0000 | ORAL_TABLET | Freq: Two times a day (BID) | ORAL | 0 refills | Status: AC

## 2024-06-29 MED ORDER — HYDROCODONE BIT-HOMATROP MBR 5-1.5 MG/5ML PO SOLN: 5.0000 mL | Freq: Four times a day (QID) | ORAL | 0 refills | Status: AC | PRN

## 2024-06-29 NOTE — Progress Notes (Signed)
 Acute Office Visit  Subjective:    Patient ID: Alexandra [name], female    DOB: 08/03/1956, 68 y.o.   MRN: [id number]  Chief Complaint  Patient presents with   Covid Positive    HPI: Patient is in today for complaints of cough, fever, malaise and congestion for the past 3 days.  She states also now cough is productive.  No one in household with similar symptoms Denies chest pain or dyspnea   Current Outpatient Medications:    Accu-Chek Softclix Lancets lancets, 4 (four) times daily., Disp: , Rfl:    ALPRAZolam  (XANAX ) 0.5 MG tablet, Take 1 tablet (0.5 mg total) by mouth as directed. Take 1 tablet 2 hours before procedure. If still take next pill right before procedure, Disp: 2 tablet, Rfl: 0   amoxicillin -clavulanate (AUGMENTIN ) 875-125 MG tablet, Take 1 tablet by mouth 2 (two) times daily., Disp: 20 tablet, Rfl: 0   ascorbic acid  (VITAMIN C) 500 MG tablet, Take 500 mg by mouth daily., Disp: , Rfl:    Blood Glucose Monitoring Suppl (ACCU-CHEK GUIDE ME) w/Device KIT, as directed., Disp: , Rfl:    Blood Glucose Monitoring Suppl DEVI, 1 each by Does not apply route in the morning, at noon, and at bedtime. May substitute to any manufacturer covered by patient's insurance., Disp: 1 each, Rfl: 0   citalopram  (CELEXA ) 20 MG tablet, TAKE 1 AND 1/2 TABLETS BY MOUTH DAILY, Disp: 45 tablet, Rfl: 0   dicyclomine  (BENTYL ) 20 MG tablet, TAKE ONE TABLET BY MOUTH EVERY 6 HOURS, Disp: 90 tablet, Rfl: 1   folic acid  (FOLVITE ) 1 MG tablet, Take 1 mg by mouth daily., Disp: , Rfl:    HYDROcodone  bit-homatropine (HYDROMET) 5-1.5 MG/5ML syrup, Take 5 mLs by mouth every 6 (six) hours as needed., Disp: 120 mL, Rfl: 0   metFORMIN  (GLUCOPHAGE ) 1000 MG tablet, TAKE ONE TABLET BY MOUTH TWICE DAILY WITH A MEAL, Disp: 180 tablet, Rfl: 0   methotrexate (RHEUMATREX) 2.5 MG tablet, Take 2.5 mg by mouth once a week. Take 10 tablets by mouth weekly, Disp: , Rfl:    mometasone  (ELOCON ) 0.1 % cream, APPLY TO AFFECTED AREA TWICE  DAILY, Disp: 15 g, Rfl: 2   montelukast  (SINGULAIR ) 10 MG tablet, TAKE ONE TABLET BY MOUTH DAILY, Disp: 90 tablet, Rfl: 0   pioglitazone  (ACTOS ) 15 MG tablet, TAKE ONE TABLET BY MOUTH DAILY, Disp: 30 tablet, Rfl: 2   predniSONE  (DELTASONE ) 5 MG tablet, TAKE 1 TABLET BY MOUTH DAILY AS NEEDED, Disp: 30 tablet, Rfl: 1   ramipril  (ALTACE ) 5 MG capsule, TAKE ONE CAPSULE BY MOUTH DAILY, Disp: 90 capsule, Rfl: 0   RYBELSUS  14 MG TABS, TAKE 1 TABLET (14 MG TOTAL) BY MOUTH DAILY, Disp: 30 tablet, Rfl: 2   solifenacin  (VESICARE ) 5 MG tablet, TAKE 1 TABLET BY MOUTH DAILY, Disp: 30 tablet, Rfl: 2   Vitamin D , Ergocalciferol , (DRISDOL ) 1.25 MG (50000 UNIT) CAPS capsule, TAKE 1 CAPSULE BY MOUTH EVERY 7 DAYS, Disp: 5 capsule, Rfl: 5   rosuvastatin  (CRESTOR ) 10 MG tablet, Take 1 tablet (10 mg total) by mouth daily., Disp: 90 tablet, Rfl: 1  No Known Allergies  ROS CONSTITUTIONAL: see HPI E/N/T: see HPI CARDIOVASCULAR: Negative for chest pain,  RESPIRATORY: see HPI GASTROINTESTINAL: Negative for abdominal pain, acid reflux symptoms, constipation, diarrhea, nausea and vomiting.      Objective:    PHYSICAL EXAM:   BP 118/80   Pulse (!) 107   Temp 99.2 F (37.3 C)   Ht 5' 3.5 (  1.613 m)   Wt 230 lb 12.8 oz (104.7 kg)   SpO2 95%   BMI 40.24 kg/m    GEN: Well nourished, well developed, in no acute distress  HEENT: normal external ears and nose - normal external auditory canals and TMS -- Lips, Teeth and Gums - normal  Oropharynx - erythema/pnd Cardiac: RRR; no murmurs,  Respiratory:  scattered rhonchi noted Skin: warm and dry, no rash  Neuro:  Alert and Oriented x 3, - CN II-Xii grossly intact Psych: euthymic mood, appropriate affect and demeanor Office Visit on 06/29/2024  Component Date Value Ref Range Status   SARS Coronavirus 2 Ag 06/29/2024 Positive (A)  Negative Final   Influenza A, POC 06/29/2024 Negative  Negative Final   Influenza B, POC 06/29/2024 Negative  Negative Final     Assessment & Plan:    Bronchitis -     Amoxicillin -Pot Clavulanate; Take 1 tablet by mouth 2 (two) times daily.  Dispense: 20 tablet; Refill: 0 -     HYDROcodone  Bit-Homatrop MBr; Take 5 mLs by mouth every 6 (six) hours as needed.  Dispense: 120 mL; Refill: 0  COVID -     POC COVID-19 BinaxNow -     POCT Influenza A/B     Follow-up: Return if symptoms worsen or fail to improve.  An After Visit Summary was printed and given to the patient.  CAMIE JONELLE NICHOLAUS DEVONNA [facility] [phone]

## 2024-07-01 ENCOUNTER — Ambulatory Visit: Payer: Self-pay

## 2024-07-01 NOTE — Telephone Encounter (Signed)
 FYI Only or Action Required?: Action required by provider: update on patient condition and requests additional/stronger antibiotic.  Patient was last seen in primary care on 06/29/2024 by Nicholaus Credit, PA-C.  Called Nurse Triage reporting Covid Positive.  Symptoms began a week ago.  Interventions attempted: Prescription medications: antibiotic, cough syrup and Rest, hydration, or home remedies.  Symptoms are: unchanged.  Triage Disposition: See HCP Within 4 Hours (Or PCP Triage)  Patient/caregiver understands and will follow disposition?: No, wishes to speak with PCP   Copied from CRM #[id number]. Topic: Clinical - Red Word Triage >> Jul 01, 2024  3:46 PM Olam RAMAN wrote: Red Word that prompted transfer to Nurse Triage: pt tested p for covid and has 103 fever, cough sore throat Reason for Disposition  [1] Taking antibiotic > 48 hours (2 days) AND [2] fever still present (SAME)  [1] Fever > 101 F (38.3 C) AND [2] age > 60 years  Answer Assessment - Initial Assessment Questions Additional info: Declines appointment. Requesting stronger antibiotic since fever is not improving.    1. INFECTION: What infection is the antibiotic being given for?     Bronchitis 2. ANTIBIOTIC: What antibiotic are you taking How many times per day?     Amox/clav 3. DURATION: When was the antibiotic started?     Monday 4. MAIN CONCERN OR SYMPTOM:  What is your main concern right now?     fever 5. BETTER-SAME-WORSE: Are you getting better, staying the same, or getting worse compared to when you first started the antibiotics? If getting worse, ask: In what way?      same 6. FEVER: Do you have a fever? If Yes, ask: What is your temperature, how was it measured, and when did it start?     103 7. SYMPTOMS: Are there any other symptoms you're concerned about? If Yes, ask: When did it start?     Cough , sore throat  8. FOLLOW-UP APPOINTMENT: Do you have a follow-up appointment with your  doctor?     No  Protocols used: COVID-19 - Diagnosed or Suspected-A-AH, Infection on Antibiotic Follow-up Call-A-AH

## 2024-07-06 ENCOUNTER — Ambulatory Visit: Payer: Self-pay

## 2024-07-06 NOTE — Telephone Encounter (Signed)
 Copied from CRM #[id number]. Topic: Clinical - Red Word Triage >> Jul 06, 2024 12:01 PM Darshell M wrote: Red Word that prompted transfer to Nurse Triage:  Reason for CRM: Patient saw provider last week for bronchitis and it is not getting better. Still having terrible cough, coughing up yellow mucus. no fever, no chest pains, no difficulty breathing. Answer Assessment - Initial Assessment Questions 1. REASON FOR CALL: What is the main reason for your call? or How can I best help you?     Patient not getting any better- she would like someone DIRECTLY from the office to reach out to discuss with her. She is very frustrated and only wants to speak with someone from the office.  Attempted CAL but office on lunch. Advised would send High Priority to the office to ask for call back.  Protocols used: Information Only Call - No Triage-A-AH

## 2024-07-07 NOTE — Telephone Encounter (Signed)
 Called patient and she stated that her cough is not getting better, she is still cough up yellow mucus, and still have some antibiotics. Patient stated she can't take cough medicine that was given to her because it makes her very sleepy.  Recommended patient to try OTC Delsym for cough during the day.

## 2024-07-07 NOTE — Telephone Encounter (Signed)
 Please call patient at her request  - triage symptoms

## 2024-07-13 ENCOUNTER — Telehealth: Payer: Self-pay | Admitting: Physician Assistant

## 2024-07-13 NOTE — Telephone Encounter (Signed)
 Copied from CRM #[id number]. Topic: General - Other >> Jul 13, 2024  3:49 PM Jasmin G wrote: Reason for CRM: Pt called to send a message to Ms. Davi's team to request antibiotics to treat a yeast infection.

## 2024-07-14 ENCOUNTER — Other Ambulatory Visit: Payer: Self-pay | Admitting: Physician Assistant

## 2024-07-14 MED ORDER — FLUCONAZOLE 150 MG PO TABS: 150.0000 mg | ORAL_TABLET | Freq: Once | ORAL | 0 refills | Status: DC

## 2024-07-14 NOTE — Telephone Encounter (Signed)
 Called patient and she stated that she was told that her antibiotics could cause her to have a yeast infection, she would like medication sent to [facility] [place]

## 2024-07-14 NOTE — Telephone Encounter (Signed)
 Rx sent.

## 2024-07-14 NOTE — Telephone Encounter (Signed)
 Please call patient --- antibiotics don't treat yeast --- if she thinks she has yeast infection I can call in antifungal med but has she tried anything yet to treat?

## 2024-08-31 ENCOUNTER — Other Ambulatory Visit: Payer: Self-pay | Admitting: Physician Assistant

## 2024-09-24 ENCOUNTER — Other Ambulatory Visit: Payer: Self-pay | Admitting: Physician Assistant

## 2024-09-24 DIAGNOSIS — F419 Anxiety disorder, unspecified: Secondary | ICD-10-CM

## 2024-09-24 DIAGNOSIS — E119 Type 2 diabetes mellitus without complications: Secondary | ICD-10-CM

## 2024-10-26 ENCOUNTER — Ambulatory Visit: Admitting: Physician Assistant
